# Patient Record
Sex: Female | Born: 2000 | Marital: Single
Health system: Southern US, Community
[De-identification: ages and names within clinical notes are randomized; demographics above are authoritative.]

## PROBLEM LIST (undated history)

## (undated) DIAGNOSIS — J189 Pneumonia, unspecified organism: Secondary | ICD-10-CM

## (undated) DIAGNOSIS — B029 Zoster without complications: Secondary | ICD-10-CM

## (undated) DIAGNOSIS — Z973 Presence of spectacles and contact lenses: Secondary | ICD-10-CM

## (undated) DIAGNOSIS — J45909 Unspecified asthma, uncomplicated: Secondary | ICD-10-CM

## (undated) DIAGNOSIS — M21619 Bunion of unspecified foot: Secondary | ICD-10-CM

## (undated) DIAGNOSIS — T7840XA Allergy, unspecified, initial encounter: Secondary | ICD-10-CM

## (undated) HISTORY — PX: WISDOM TOOTH EXTRACTION: SHX21

## (undated) HISTORY — PX: TYMPANOSTOMY TUBE PLACEMENT: SHX32

---
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
## 2021-01-05 ENCOUNTER — Telehealth: Payer: Self-pay | Admitting: Podiatry

## 2021-01-05 ENCOUNTER — Other Ambulatory Visit: Payer: Self-pay | Admitting: Podiatry

## 2021-01-05 DIAGNOSIS — M2012 Hallux valgus (acquired), left foot: Secondary | ICD-10-CM | POA: Diagnosis not present

## 2021-01-05 MED ORDER — IBUPROFEN 800 MG PO TABS
800.0000 mg | ORAL_TABLET | Freq: Four times a day (QID) | ORAL | 1 refills | Status: AC | PRN
Start: 2021-01-05 — End: ?

## 2021-01-05 MED ORDER — OXYCODONE-ACETAMINOPHEN 5-325 MG PO TABS: 1.0000 | ORAL_TABLET | ORAL | 0 refills | Status: AC | PRN

## 2021-01-05 NOTE — Telephone Encounter (Signed)
Patients mom calling to let you know the pharmacy couldn't fill her Rx for percocet. They told the patient your Rx instructions were not clear and will not fill the meds until they get clarity or a new script. If a new Rx is to be made, mom is requesting it be for 10mg  instead of 5mg , If that is ok. Please advise.

## 2021-01-06 ENCOUNTER — Telehealth: Payer: Self-pay | Admitting: *Deleted

## 2021-01-06 ENCOUNTER — Other Ambulatory Visit: Payer: Self-pay | Admitting: Podiatry

## 2021-01-06 MED ORDER — OXYCODONE-ACETAMINOPHEN 10-325 MG PO TABS: 1.0000 | ORAL_TABLET | ORAL | 0 refills | Status: AC | PRN

## 2021-01-06 MED ORDER — HYDROCODONE-ACETAMINOPHEN 10-325 MG PO TABS: 1.0000 | ORAL_TABLET | Freq: Four times a day (QID) | ORAL | 0 refills | Status: AC | PRN

## 2021-01-06 MED ORDER — OXYCODONE-ACETAMINOPHEN 5-325 MG PO TABS: 1.0000 | ORAL_TABLET | ORAL | 0 refills | Status: AC | PRN

## 2021-01-06 NOTE — Telephone Encounter (Signed)
Thank you, I will let the patient know. Will I also need to call the pharmacy to get the other ones canceled?

## 2021-01-06 NOTE — Addendum Note (Signed)
Addended by: Nicholes Rough on: 01/06/2021 02:25 PM   Modules accepted: Orders

## 2021-01-06 NOTE — Telephone Encounter (Signed)
I sent in another prescription with 10 mg.  Make sure they feel that 1.  They can cancel the other ones.

## 2021-01-06 NOTE — Telephone Encounter (Signed)
WE are going to do 10 mg one

## 2021-01-06 NOTE — Telephone Encounter (Signed)
Called pharmacy to informed of change of medication(hydrocodone-ace 10-325mg ) that has been sent and that patient is allergic to Percocet.   Called patient  to let her know that the new prescription is at pharmacy, ready for pick up,verbalized understanding.

## 2021-01-06 NOTE — Telephone Encounter (Signed)
I sent a different pain medication in and see if that helps.  She should not take Benadryl and Percocet together.  She can stop the Percocet so that where she no longer is itching.  Also call the pharmacy and let them know that patient is allergic to Percocet and can fill the Norco

## 2021-01-06 NOTE — Telephone Encounter (Signed)
[facility] pharmacy is wanting to know which prescription to fill. Stated that there were 2 different strengths sent in for Percocet(5-325 mg,10-325 mg). May call at : [phone],Please advise.

## 2021-01-06 NOTE — Telephone Encounter (Signed)
Patients mother calling to inform she has not been contacted by pharmacy to pick up the percocet 10mg . Mom also states that the patient has severe itching due to percocet and has been taking 2 benadryl with pain medication. Wants to know is there is anything else that can be done to relive the itching. Please advise

## 2021-01-12 ENCOUNTER — Other Ambulatory Visit: Payer: Self-pay | Admitting: Podiatry

## 2021-01-12 ENCOUNTER — Encounter: Payer: Self-pay | Admitting: Podiatry

## 2021-01-12 MED ORDER — OXYCODONE-ACETAMINOPHEN 10-325 MG PO TABS: 1.0000 | ORAL_TABLET | ORAL | 0 refills | Status: DC | PRN

## 2021-01-13 ENCOUNTER — Other Ambulatory Visit: Payer: Self-pay | Admitting: Podiatry

## 2021-01-13 NOTE — Telephone Encounter (Signed)
Please advisee 

## 2021-01-13 NOTE — Telephone Encounter (Signed)
Patients mom calling to request that percocet 5mg  be refilled. The Norco, which replaced the percocet 10mg  due to patient having allergic reaction (severe itching and was taking benadryl ), mom says its not working for pain. Mom states that the percocet 5mg  does not make her itch like the 10mg  (previously said 5mg  wasn't enough for pain, so she was doubling up to make 10mg ).

## 2021-01-14 ENCOUNTER — Other Ambulatory Visit: Payer: Self-pay | Admitting: Podiatry

## 2021-01-14 ENCOUNTER — Encounter: Payer: Self-pay | Admitting: Podiatry

## 2021-01-14 ENCOUNTER — Other Ambulatory Visit: Payer: Self-pay

## 2021-01-14 ENCOUNTER — Ambulatory Visit (INDEPENDENT_AMBULATORY_CARE_PROVIDER_SITE_OTHER): Payer: 59

## 2021-01-14 ENCOUNTER — Ambulatory Visit (INDEPENDENT_AMBULATORY_CARE_PROVIDER_SITE_OTHER): Payer: 59 | Admitting: Podiatry

## 2021-01-14 DIAGNOSIS — M2012 Hallux valgus (acquired), left foot: Secondary | ICD-10-CM

## 2021-01-14 DIAGNOSIS — Z9889 Other specified postprocedural states: Secondary | ICD-10-CM

## 2021-01-14 MED ORDER — DOXYCYCLINE HYCLATE 100 MG PO TABS: 100.0000 mg | ORAL_TABLET | Freq: Two times a day (BID) | ORAL | 0 refills | Status: AC

## 2021-01-14 NOTE — Progress Notes (Signed)
Subjective:  Patient ID: Joanna Jordan, female    DOB: 05-17-2001,  MRN: [id number]  Chief Complaint  Patient presents with  . Routine Post Op    POV#1 -pt denies N/V/F/Ch -pt states," it throbbs; 7/10" - dressing intact Tx: percocet, boot and scooter    DOS: 01/05/2021 Procedure: Left foot Lapidus procedure with head osteotomy  20 y.o. female returns for post-op check.  Patient is doing well.  She is doing good on pain medication.  She denies any other acute complaint she is nonweightbearing with a knee scooter.  Review of Systems: Negative except as noted in the HPI. Denies N/V/F/Ch.  Past Medical History:  Diagnosis Date  . Allergy    " seasonal "  . Asthma   . 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 Pneumonia    PMH as a child  . Shingles rash    at age 38 after varicella vaccine  . Wears glasses    and contact lenses    Current Outpatient Medications:  .  doxycycline (VIBRA-TABS) 100 MG tablet, Take 1 tablet (100 mg total) by mouth 2 (two) times daily., Disp: 20 tablet, Rfl: 0 .  acetaminophen-codeine (TYLENOL #3) 300-30 MG tablet, Take 1-2 tablets by mouth every 4 (four) hours as needed for moderate pain., Disp: 30 tablet, Rfl: 0 .  acetaminophen-codeine (TYLENOL #3) 300-30 MG tablet, Take 1-2 tablets by mouth every 4 (four) hours as needed for moderate pain., Disp: 30 tablet, Rfl: 0 .  acetaminophen-codeine (TYLENOL #3) 300-30 MG tablet, Take 1-2 tablets by mouth every 4 (four) hours as needed for moderate pain., Disp: 30 tablet, Rfl: 0 .  acetaminophen-codeine (TYLENOL #3) 300-30 MG tablet, Take 1-2 tablets by mouth every 4 (four) hours as needed for moderate pain., Disp: 30 tablet, Rfl: 0 .  bismuth subsalicylate (PEPTO BISMOL) 262 MG chewable tablet, Chew 524 mg by mouth daily as needed for indigestion or diarrhea or loose stools., Disp: , Rfl:  .  cetirizine (ZYRTEC) 10 MG tablet, Take 10 mg by mouth every evening. , Disp: ,  Rfl:  .  cholecalciferol (VITAMIN D3) 25 MCG (1000 UNIT) tablet, Take 1,000 Units by mouth daily., Disp: , Rfl:  .  fluticasone-salmeterol (ADVAIR HFA) 115-21 MCG/ACT inhaler, Inhale 2 puffs into the lungs 2 (two) times daily as needed (shortness of breath)., Disp: , Rfl:  .  HYDROcodone-acetaminophen (NORCO) 10-325 MG tablet, Take 1 tablet by mouth every 6 (six) hours as needed., Disp: 30 tablet, Rfl: 0 .  ibuprofen (ADVIL) 800 MG tablet, Take 1 tablet (800 mg total) by mouth every 6 (six) hours as needed., Disp: 60 tablet, Rfl: 1 .  ibuprofen (ADVIL) 800 MG tablet, Take 1 tablet (800 mg total) by mouth every 6 (six) hours as needed., Disp: 60 tablet, Rfl: 1 .  ibuprofen (ADVIL) 800 MG tablet, Take 1 tablet (800 mg total) by mouth every 6 (six) hours as needed., Disp: 60 tablet, Rfl: 1 .  ketorolac (TORADOL) 10 MG tablet, Take 1 tablet (10 mg total) by mouth every 6 (six) hours as needed., Disp: 20 tablet, Rfl: 0 .  Multiple Vitamin (MULTIVITAMIN WITH MINERALS) TABS tablet, Take 1 tablet by mouth every evening., Disp: , Rfl:  .  norethindrone-ethinyl estradiol (LOESTRIN) 1-20 MG-MCG tablet, Take 1 tablet by mouth every evening. , Disp: , Rfl:  .  oxyCODONE-acetaminophen (PERCOCET) 10-325 MG tablet, Take 1 tablet by mouth every 4 (four) hours as needed for pain., Disp:  30 tablet, Rfl: 0 .  oxyCODONE-acetaminophen (PERCOCET) 10-325 MG tablet, Take 1 tablet by mouth every 4 (four) hours as needed for pain., Disp: 30 tablet, Rfl: 0 .  oxyCODONE-acetaminophen (PERCOCET) 5-325 MG tablet, Take 1-2 tablets by mouth every 4 (four) hours as needed for severe pain., Disp: 30 tablet, Rfl: 0 .  oxyCODONE-acetaminophen (PERCOCET) 5-325 MG tablet, Take 1-2 tablets by mouth every 4 (four) hours as needed for severe pain., Disp: 30 tablet, Rfl: 0 .  oxyCODONE-acetaminophen (PERCOCET) 5-325 MG tablet, Take 1-2 tablets by mouth every 4 (four) hours as needed for severe pain., Disp: 30 tablet, Rfl: 0  Social History    Tobacco Use  Smoking Status Never Smoker  Smokeless Tobacco Never Used    No Known Allergies Objective:  There were no vitals filed for this visit. There is no height or weight on file to calculate BMI. Constitutional Well developed. Well nourished.  Vascular Foot warm and well perfused. Capillary refill normal to all digits.   Neurologic Normal speech. Oriented to person, place, and time. Epicritic sensation to light touch grossly present bilaterally.  Dermatologic Skin healing well without signs of infection. Skin edges well coapted without signs of infection.  Mild erythema noted.  Orthopedic:  No tenderness to palpation noted about the surgical site.   Radiographs: 3 views of skeletally mature adult left foot: Good correction alignment noted.  Hardware is intact.  No signs of backing out or loosening noted.  Reduction intermetatarsal angle noted.  Small osteotomy of the second metatarsal noted secondary to joint prepping Assessment:   1. Hallux abducto valgus, left   2. Hallux interphalangeus, acquired, left   3. Status post foot surgery    Plan:  Patient was evaluated and treated and all questions answered.  S/p foot surgery left -Progressing as expected post-operatively. -XR: See above number -WB Status: Nonweightbearing in left lower extremity in knee scooter -Sutures: Intact.  No clinical signs of dehiscence noted.  No complication noted. -Medications: Doxycycline for skin and soft tissue prophylaxis -Foot redressed.  No follow-ups on file.

## 2021-01-19 ENCOUNTER — Other Ambulatory Visit: Payer: Self-pay | Admitting: Podiatry

## 2021-01-19 ENCOUNTER — Encounter: Payer: Self-pay | Admitting: Podiatry

## 2021-01-19 MED ORDER — OXYCODONE-ACETAMINOPHEN 5-325 MG PO TABS: 1.0000 | ORAL_TABLET | ORAL | 0 refills | Status: AC | PRN

## 2021-01-26 ENCOUNTER — Encounter: Payer: Self-pay | Admitting: Podiatry

## 2021-01-27 ENCOUNTER — Other Ambulatory Visit: Payer: Self-pay | Admitting: Podiatry

## 2021-01-27 MED ORDER — OXYCODONE-ACETAMINOPHEN 5-325 MG PO TABS: 1.0000 | ORAL_TABLET | ORAL | 0 refills | Status: AC | PRN

## 2021-01-28 ENCOUNTER — Other Ambulatory Visit: Payer: Self-pay

## 2021-01-28 ENCOUNTER — Encounter: Payer: Self-pay | Admitting: Podiatry

## 2021-01-28 ENCOUNTER — Ambulatory Visit (INDEPENDENT_AMBULATORY_CARE_PROVIDER_SITE_OTHER): Payer: 59 | Admitting: Podiatry

## 2021-01-28 DIAGNOSIS — Z9889 Other specified postprocedural states: Secondary | ICD-10-CM

## 2021-01-28 DIAGNOSIS — M2012 Hallux valgus (acquired), left foot: Secondary | ICD-10-CM

## 2021-01-28 NOTE — Progress Notes (Signed)
Subjective:  Patient ID: Joanna Jordan, female    DOB: May 07, 2001,  MRN: [id number]  Chief Complaint  Patient presents with  . Routine Post Op    POST OP DOS 4.11.22     DOS: 01/05/2021 Procedure: Left foot Lapidus procedure with head osteotomy  20 y.o. female returns for post-op check.  Patient is doing well.  She is doing good on pain medication.  She denies any other acute complaint she is nonweightbearing with a knee scooter.  Review of Systems: Negative except as noted in the HPI. Denies N/V/F/Ch.  Past Medical History:  Diagnosis Date  . Allergy    " seasonal "  . Asthma   . 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 Pneumonia    PMH as a child  . Shingles rash    at age 44 after varicella vaccine  . Wears glasses    and contact lenses    Current Outpatient Medications:  .  acetaminophen-codeine (TYLENOL #3) 300-30 MG tablet, Take 1-2 tablets by mouth every 4 (four) hours as needed for moderate pain., Disp: 30 tablet, Rfl: 0 .  acetaminophen-codeine (TYLENOL #3) 300-30 MG tablet, Take 1-2 tablets by mouth every 4 (four) hours as needed for moderate pain., Disp: 30 tablet, Rfl: 0 .  acetaminophen-codeine (TYLENOL #3) 300-30 MG tablet, Take 1-2 tablets by mouth every 4 (four) hours as needed for moderate pain., Disp: 30 tablet, Rfl: 0 .  acetaminophen-codeine (TYLENOL #3) 300-30 MG tablet, Take 1-2 tablets by mouth every 4 (four) hours as needed for moderate pain., Disp: 30 tablet, Rfl: 0 .  bismuth subsalicylate (PEPTO BISMOL) 262 MG chewable tablet, Chew 524 mg by mouth daily as needed for indigestion or diarrhea or loose stools., Disp: , Rfl:  .  cetirizine (ZYRTEC) 10 MG tablet, Take 10 mg by mouth every evening. , Disp: , Rfl:  .  cholecalciferol (VITAMIN D3) 25 MCG (1000 UNIT) tablet, Take 1,000 Units by mouth daily., Disp: , Rfl:  .  doxycycline (VIBRA-TABS) 100 MG tablet, Take 1 tablet (100 mg total) by mouth 2 (two) times  daily., Disp: 20 tablet, Rfl: 0 .  fluticasone-salmeterol (ADVAIR HFA) 115-21 MCG/ACT inhaler, Inhale 2 puffs into the lungs 2 (two) times daily as needed (shortness of breath)., Disp: , Rfl:  .  HYDROcodone-acetaminophen (NORCO) 10-325 MG tablet, Take 1 tablet by mouth every 6 (six) hours as needed., Disp: 30 tablet, Rfl: 0 .  ibuprofen (ADVIL) 800 MG tablet, Take 1 tablet (800 mg total) by mouth every 6 (six) hours as needed., Disp: 60 tablet, Rfl: 1 .  ibuprofen (ADVIL) 800 MG tablet, Take 1 tablet (800 mg total) by mouth every 6 (six) hours as needed., Disp: 60 tablet, Rfl: 1 .  ibuprofen (ADVIL) 800 MG tablet, Take 1 tablet (800 mg total) by mouth every 6 (six) hours as needed., Disp: 60 tablet, Rfl: 1 .  ketorolac (TORADOL) 10 MG tablet, Take 1 tablet (10 mg total) by mouth every 6 (six) hours as needed., Disp: 20 tablet, Rfl: 0 .  Multiple Vitamin (MULTIVITAMIN WITH MINERALS) TABS tablet, Take 1 tablet by mouth every evening., Disp: , Rfl:  .  norethindrone-ethinyl estradiol (LOESTRIN) 1-20 MG-MCG tablet, Take 1 tablet by mouth every evening. , Disp: , Rfl:  .  oxyCODONE-acetaminophen (PERCOCET) 10-325 MG tablet, Take 1 tablet by mouth every 4 (four) hours as needed for pain., Disp: 30 tablet, Rfl: 0 .  oxyCODONE-acetaminophen (PERCOCET) 10-325 MG tablet, Take  1 tablet by mouth every 4 (four) hours as needed for pain., Disp: 30 tablet, Rfl: 0 .  oxyCODONE-acetaminophen (PERCOCET) 5-325 MG tablet, Take 1-2 tablets by mouth every 4 (four) hours as needed for severe pain., Disp: 30 tablet, Rfl: 0 .  oxyCODONE-acetaminophen (PERCOCET) 5-325 MG tablet, Take 1-2 tablets by mouth every 4 (four) hours as needed for severe pain., Disp: 30 tablet, Rfl: 0 .  oxyCODONE-acetaminophen (PERCOCET) 5-325 MG tablet, Take 1-2 tablets by mouth every 4 (four) hours as needed for severe pain., Disp: 30 tablet, Rfl: 0 .  oxyCODONE-acetaminophen (PERCOCET) 5-325 MG tablet, Take 1-2 tablets by mouth every 4 (four) hours  as needed for severe pain., Disp: 30 tablet, Rfl: 0 .  oxyCODONE-acetaminophen (PERCOCET) 5-325 MG tablet, Take 1-2 tablets by mouth every 4 (four) hours as needed for severe pain., Disp: 30 tablet, Rfl: 0  Social History   Tobacco Use  Smoking Status Never Smoker  Smokeless Tobacco Never Used    No Known Allergies Objective:  There were no vitals filed for this visit. There is no height or weight on file to calculate BMI. Constitutional Well developed. Well nourished.  Vascular Foot warm and well perfused. Capillary refill normal to all digits.   Neurologic Normal speech. Oriented to person, place, and time. Epicritic sensation to light touch grossly present bilaterally.  Dermatologic Skin healing well without signs of infection. Skin edges well coapted without signs of infection.  Mild erythema noted.  Orthopedic:  No tenderness to palpation noted about the surgical site.   Radiographs: 3 views of skeletally mature adult left foot: Good correction alignment noted.  Hardware is intact.  No signs of backing out or loosening noted.  Reduction intermetatarsal angle noted.  Small osteotomy of the second metatarsal noted secondary to joint prepping Assessment:   1. Hallux abducto valgus, left   2. Hallux interphalangeus, acquired, left   3. Status post foot surgery    Plan:  Patient was evaluated and treated and all questions answered.  S/p foot surgery left -Progressing as expected post-operatively. -XR: See above number -WB Status: Begin weightbearing as tolerated to the left lower extremity in 2 weeks with a cam boot -Sutures: Removed no clinical signs of dehiscence noted.  No complication noted. -Medications: None -Foot redressed.  No follow-ups on file.

## 2021-02-08 ENCOUNTER — Encounter: Payer: Self-pay | Admitting: Podiatry

## 2021-02-10 ENCOUNTER — Other Ambulatory Visit: Payer: Self-pay | Admitting: Podiatry

## 2021-02-10 MED ORDER — OXYCODONE-ACETAMINOPHEN 5-325 MG PO TABS: 1.0000 | ORAL_TABLET | ORAL | 0 refills | Status: AC | PRN

## 2021-03-04 ENCOUNTER — Ambulatory Visit (INDEPENDENT_AMBULATORY_CARE_PROVIDER_SITE_OTHER): Payer: 59

## 2021-03-04 ENCOUNTER — Other Ambulatory Visit: Payer: Self-pay

## 2021-03-04 ENCOUNTER — Ambulatory Visit (INDEPENDENT_AMBULATORY_CARE_PROVIDER_SITE_OTHER): Payer: 59 | Admitting: Podiatry

## 2021-03-04 DIAGNOSIS — T8484XA Pain due to internal orthopedic prosthetic devices, implants and grafts, initial encounter: Secondary | ICD-10-CM

## 2021-03-04 DIAGNOSIS — M2012 Hallux valgus (acquired), left foot: Secondary | ICD-10-CM | POA: Diagnosis not present

## 2021-03-04 DIAGNOSIS — Z9889 Other specified postprocedural states: Secondary | ICD-10-CM

## 2021-03-10 ENCOUNTER — Encounter: Payer: Self-pay | Admitting: Podiatry

## 2021-03-10 NOTE — Progress Notes (Signed)
Subjective:  Patient ID: Joanna Jordan, female    DOB: 2001/08/31,  MRN: [id number]  Chief Complaint  Patient presents with   Routine Post Op    POST OP DOS 4.11.22    DOS: 01/05/2021 Procedure: Left foot Lapidus procedure with head osteotomy  20 y.o. female returns for post-op check.  Patient is doing well.  She does not have much pain.  She has been weightbearing as tolerated regular shoes.  Review of Systems: Negative except as noted in the HPI. Denies N/V/F/Ch.  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, Disp: 30 tablet, Rfl: 0   acetaminophen-codeine (TYLENOL #3) 300-30 MG tablet, Take 1-2 tablets by mouth every 4 (four) hours as needed for moderate pain., Disp: 30 tablet, Rfl: 0   bismuth subsalicylate (PEPTO BISMOL) 262 MG chewable tablet, Chew 524 mg by mouth daily as needed for indigestion or diarrhea or loose stools., Disp: , Rfl:    cetirizine (ZYRTEC) 10 MG tablet, Take 10 mg by mouth every evening. , Disp: , Rfl:    cholecalciferol (VITAMIN D3) 25 MCG (1000 UNIT) tablet, Take 1,000 Units by mouth daily., Disp: , Rfl:    doxycycline (VIBRA-TABS) 100 MG tablet, Take 1 tablet (100 mg total) by mouth 2 (two) times daily., Disp: 20 tablet, Rfl: 0    fluticasone-salmeterol (ADVAIR HFA) 115-21 MCG/ACT inhaler, Inhale 2 puffs into the lungs 2 (two) times daily as needed (shortness of breath)., Disp: , Rfl:    HYDROcodone-acetaminophen (NORCO) 10-325 MG tablet, Take 1 tablet by mouth every 6 (six) hours as needed., Disp: 30 tablet, Rfl: 0   ibuprofen (ADVIL) 800 MG tablet, Take 1 tablet (800 mg total) by mouth every 6 (six) hours as needed., Disp: 60 tablet, Rfl: 1   ibuprofen (ADVIL) 800 MG tablet, Take 1 tablet (800 mg total) by mouth every 6 (six) hours as needed., Disp: 60 tablet, Rfl: 1   ibuprofen (ADVIL) 800 MG tablet, Take 1 tablet (800 mg total) by mouth every 6 (six) hours as needed., Disp: 60 tablet, Rfl: 1   ketorolac (TORADOL) 10 MG tablet, Take 1 tablet (10 mg total) by mouth every 6 (six) hours as needed., Disp: 20 tablet, Rfl: 0   Multiple Vitamin (MULTIVITAMIN WITH MINERALS) TABS tablet, Take 1 tablet by mouth every evening., Disp: , Rfl:    norethindrone-ethinyl estradiol (LOESTRIN) 1-20 MG-MCG tablet, Take 1 tablet by mouth every evening. , Disp: , Rfl:    oxyCODONE-acetaminophen (PERCOCET) 10-325 MG tablet, Take 1 tablet by mouth every 4 (four) hours as needed for pain., Disp: 30 tablet, Rfl: 0   oxyCODONE-acetaminophen (PERCOCET) 10-325 MG tablet, Take 1 tablet by mouth every 4 (four)  hours as needed for pain., Disp: 30 tablet, Rfl: 0   oxyCODONE-acetaminophen (PERCOCET) 5-325 MG tablet, Take 1-2 tablets by mouth every 4 (four) hours as needed for severe pain., Disp: 30 tablet, Rfl: 0   oxyCODONE-acetaminophen (PERCOCET) 5-325 MG tablet, Take 1-2 tablets by mouth every 4 (four) hours as needed for severe pain., Disp: 30 tablet, Rfl: 0   oxyCODONE-acetaminophen (PERCOCET) 5-325 MG tablet, Take 1-2 tablets by mouth every 4 (four) hours as needed for severe pain., Disp: 30 tablet, Rfl: 0   oxyCODONE-acetaminophen (PERCOCET) 5-325 MG tablet, Take 1-2 tablets by mouth every 4 (four) hours as needed for severe pain., Disp: 30 tablet,  Rfl: 0   oxyCODONE-acetaminophen (PERCOCET) 5-325 MG tablet, Take 1-2 tablets by mouth every 4 (four) hours as needed for severe pain., Disp: 30 tablet, Rfl: 0   oxyCODONE-acetaminophen (PERCOCET) 5-325 MG tablet, Take 1-2 tablets by mouth every 4 (four) hours as needed for severe pain., Disp: 30 tablet, Rfl: 0  Social History   Tobacco Use  Smoking Status Never  Smokeless Tobacco Never    No Known Allergies Objective:  There were no vitals filed for this visit. There is no height or weight on file to calculate BMI. Constitutional Well developed. Well nourished.  Vascular Foot warm and well perfused. Capillary refill normal to all digits.   Neurologic Normal speech. Oriented to person, place, and time. Epicritic sensation to light touch grossly present bilaterally.  Dermatologic Skin healing well without signs of infection. Skin edges well coapted without signs of infection.  Mild erythema noted.  Orthopedic:  No tenderness to palpation noted about the surgical site.   Radiographs: 3 views of skeletally mature adult left foot: Good correction alignment noted.  Hardware is intact.  No signs of backing out or loosening noted.  Reduction intermetatarsal angle noted.  Small osteotomy of the second metatarsal noted secondary to joint prepping Assessment:   1. Hallux abducto valgus, left   2. Hallux interphalangeus, acquired, left   3. Status post foot surgery    Plan:  Patient was evaluated and treated and all questions answered.  S/p foot surgery left -Patient has clinically transition to shoes.  However she is having pain near the hardware.  Painful orthopedic hardware -Clinically patient is having pain as well as superficial skin necrosis at the plate screw combination of the hardware at the first tarsometatarsal joint fusion.  Given this clinical finding I believe patient will benefit from a CT scan to evaluate the fusion of the first tarsometatarsal joint fusion.  If it has  already consolidated we will begin to consolidate we will plan on doing removal of the plates and screws.  Patient states understanding. -CT scan was sent    No follow-ups on file.

## 2021-04-03 NOTE — Telephone Encounter (Signed)
Patient would like to know if prior Berkley Harvey is needed for MR. Please advise.

## 2021-04-06 ENCOUNTER — Telehealth: Payer: Self-pay | Admitting: *Deleted

## 2021-04-06 NOTE — Telephone Encounter (Signed)
Patient has been scheduled for 04/10/21@4 ;30.Approval date from July 11th 2022-Aug. Case #[id number] Ref#[id number] Approved:[id number]-spoke w/ E. Waters

## 2021-04-07 NOTE — Telephone Encounter (Signed)
Approved#[id number]-spoke w/ E.Waters for ct scan, sent to [facility] imaging

## 2021-04-10 ENCOUNTER — Ambulatory Visit
Admission: RE | Admit: 2021-04-10 | Discharge: 2021-04-10 | Disposition: A | Discharge status: Home / Self Care | Payer: No Typology Code available for payment source | Source: Ambulatory Visit | Attending: Podiatry | Admitting: Podiatry

## 2021-04-10 DIAGNOSIS — M2012 Hallux valgus (acquired), left foot: Secondary | ICD-10-CM

## 2021-04-15 ENCOUNTER — Ambulatory Visit (INDEPENDENT_AMBULATORY_CARE_PROVIDER_SITE_OTHER): Payer: 59 | Admitting: Podiatry

## 2021-04-15 ENCOUNTER — Other Ambulatory Visit: Payer: Self-pay

## 2021-04-15 DIAGNOSIS — T8484XA Pain due to internal orthopedic prosthetic devices, implants and grafts, initial encounter: Secondary | ICD-10-CM

## 2021-04-15 DIAGNOSIS — M960 Pseudarthrosis after fusion or arthrodesis: Secondary | ICD-10-CM

## 2021-04-15 DIAGNOSIS — Z01818 Encounter for other preprocedural examination: Secondary | ICD-10-CM | POA: Diagnosis not present

## 2021-04-15 DIAGNOSIS — M2012 Hallux valgus (acquired), left foot: Secondary | ICD-10-CM | POA: Diagnosis not present

## 2021-04-21 ENCOUNTER — Encounter: Payer: Self-pay | Admitting: Podiatry

## 2021-04-21 NOTE — Progress Notes (Signed)
Subjective:  Patient ID: Joanna Jordan, female    DOB: 2001/02/15,  MRN: [id number]  Chief Complaint  Patient presents with   Routine Post Op    POST OP DOS 4.11.22    DOS: 01/05/2021 Procedure: Left foot Lapidus procedure with head osteotomy  20 y.o. female returns for post-op check.  Patient presents for follow-up of continuous orthopedic hardware pain as well as concern for hallux interphalangeus.  Patient would like to discuss a CT scan which shows partial nonunion.  She would like to have the hardware removed as well and fix interphalangeus.  She would like to discuss surgical options at this time.  She denies any other acute complaints.  Review of Systems: Negative except as noted in the HPI. Denies N/V/F/Ch.  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, Disp: 30 tablet, Rfl: 0   acetaminophen-codeine (TYLENOL #3) 300-30 MG tablet, Take 1-2 tablets by mouth every 4 (four) hours as needed for moderate pain., Disp: 30 tablet, Rfl: 0   bismuth subsalicylate (PEPTO BISMOL) 262 MG chewable tablet, Chew 524 mg by mouth daily as needed for indigestion or diarrhea or loose stools., Disp: , Rfl:    cetirizine (ZYRTEC) 10 MG tablet, Take 10 mg by mouth every evening. , Disp: , Rfl:     cholecalciferol (VITAMIN D3) 25 MCG (1000 UNIT) tablet, Take 1,000 Units by mouth daily., Disp: , Rfl:    doxycycline (VIBRA-TABS) 100 MG tablet, Take 1 tablet (100 mg total) by mouth 2 (two) times daily., Disp: 20 tablet, Rfl: 0   fluticasone-salmeterol (ADVAIR HFA) 115-21 MCG/ACT inhaler, Inhale 2 puffs into the lungs 2 (two) times daily as needed (shortness of breath)., Disp: , Rfl:    HYDROcodone-acetaminophen (NORCO) 10-325 MG tablet, Take 1 tablet by mouth every 6 (six) hours as needed., Disp: 30 tablet, Rfl: 0   ibuprofen (ADVIL) 800 MG tablet, Take 1 tablet (800 mg total) by mouth every 6 (six) hours as needed., Disp: 60 tablet, Rfl: 1   ibuprofen (ADVIL) 800 MG tablet, Take 1 tablet (800 mg total) by mouth every 6 (six) hours as needed., Disp: 60 tablet, Rfl: 1   ibuprofen (ADVIL) 800 MG tablet, Take 1 tablet (800 mg total) by mouth every 6 (six) hours as needed., Disp: 60 tablet, Rfl: 1   ketorolac (TORADOL) 10 MG tablet, Take 1 tablet (10 mg total) by mouth every 6 (six) hours as needed., Disp: 20 tablet, Rfl: 0   Multiple Vitamin (MULTIVITAMIN WITH MINERALS) TABS tablet, Take 1 tablet by mouth every evening., Disp: , Rfl:    norethindrone-ethinyl estradiol (LOESTRIN) 1-20 MG-MCG tablet, Take 1 tablet by mouth every evening. , Disp: , Rfl:  oxyCODONE-acetaminophen (PERCOCET) 10-325 MG tablet, Take 1 tablet by mouth every 4 (four) hours as needed for pain., Disp: 30 tablet, Rfl: 0   oxyCODONE-acetaminophen (PERCOCET) 10-325 MG tablet, Take 1 tablet by mouth every 4 (four) hours as needed for pain., Disp: 30 tablet, Rfl: 0   oxyCODONE-acetaminophen (PERCOCET) 5-325 MG tablet, Take 1-2 tablets by mouth every 4 (four) hours as needed for severe pain., Disp: 30 tablet, Rfl: 0   oxyCODONE-acetaminophen (PERCOCET) 5-325 MG tablet, Take 1-2 tablets by mouth every 4 (four) hours as needed for severe pain., Disp: 30 tablet, Rfl: 0   oxyCODONE-acetaminophen (PERCOCET) 5-325 MG tablet, Take 1-2  tablets by mouth every 4 (four) hours as needed for severe pain., Disp: 30 tablet, Rfl: 0   oxyCODONE-acetaminophen (PERCOCET) 5-325 MG tablet, Take 1-2 tablets by mouth every 4 (four) hours as needed for severe pain., Disp: 30 tablet, Rfl: 0   oxyCODONE-acetaminophen (PERCOCET) 5-325 MG tablet, Take 1-2 tablets by mouth every 4 (four) hours as needed for severe pain., Disp: 30 tablet, Rfl: 0   oxyCODONE-acetaminophen (PERCOCET) 5-325 MG tablet, Take 1-2 tablets by mouth every 4 (four) hours as needed for severe pain., Disp: 30 tablet, Rfl: 0  Social History   Tobacco Use  Smoking Status Never  Smokeless Tobacco Never    No Known Allergies Objective:  There were no vitals filed for this visit. There is no height or weight on file to calculate BMI. Constitutional Well developed. Well nourished.  Vascular Foot warm and well perfused. Capillary refill normal to all digits.   Neurologic Normal speech. Oriented to person, place, and time. Epicritic sensation to light touch grossly present bilaterally.  Dermatologic Skin has completely epithelialized.  Hallux interphalangeus angle increased noted.  No bunion deformity noted.  Pain on palpation to the orthopedic hardware.  Orthopedic:  No tenderness to palpation noted about the surgical site.   Radiographs: 3 views of skeletally mature adult left foot: Good correction alignment noted.  Hardware is intact.  No signs of backing out or loosening noted.  Reduction intermetatarsal angle noted.  Small osteotomy of the second metatarsal noted secondary to joint prepping  1. Prior first TMT joint arthrodesis with partial osseous fusion. No hardware complication. 2. Partially healed osteotomy at the base of the second metatarsal shaft. Assessment:   1. Hallux interphalangeus, acquired, left   2. Painful orthopaedic hardware (HCC)   3. Nonunion after arthrodesis   4. Preoperative examination     Plan:  Patient was evaluated and treated and  all questions answered.  S/p foot surgery left -Patient has clinically transition to shoes.  However she is having pain near the hardware.  Painful orthopedic hardware -Clinically she still continues to have orthopedic hardware.  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  At this time I discussed with the patient that we can plan for surgically revising the fusion if there is not adequate fusion associated with it.  I can augment the fusion with staple fixation to allow for further compression on the plantar ledge.  I discussed this with the patient in extensive detail the patient is also here with her mother today and I discussed the case.  We will plan on doing revision of the tarsometatarsal joint fusion with the use of staple fixation as needed.  I will plan on removing all the previous hardware as well except for the first metatarsal osteotomy site.  If there is adequate fusion  then I will hold off on revising it and will discuss bone stimulator. -Informed surgical risk consent was reviewed and read aloud to the patient.  I reviewed the films.  I have discussed my findings with the patient in great detail.  I have discussed all risks including but not limited to infection, stiffness, scarring, limp, disability, deformity, damage to blood vessels and nerves, numbness, poor healing, need for braces, arthritis, chronic pain, amputation, death.  All benefits and realistic expectations discussed in great detail.  I have made no promises as to the outcome.  I have provided realistic expectations.  I have offered the patient a 2nd opinion, which they have declined and assured me they preferred to proceed despite the risks   Left hallux interphalangeus -I also discussed with her while I am removing the hardware and revise the first tarsometatarsal joint fusion we can discuss phalangeal osteotomy to help straighten out the toe.  I  discussed the plan in extensive detail with the patient and her mother.  They both left would like to proceed despite all the risk and complication of surgery with her.    No follow-ups on file.

## 2021-05-13 ENCOUNTER — Telehealth: Payer: Self-pay | Admitting: Urology

## 2021-05-13 NOTE — Telephone Encounter (Signed)
DOS - 05/18/21  Quintella Reichert OSTEOTOMY LEFT --- 65784 REPAIR NON BUNION LEFT --- 69629 REMOVAL FIXATION DEEP LEFT ---  20680   [facility] EFFECTIVE DATE - 09/27/20   PLAN DEDUCTIBLE - $2,000.00 W/ $0.00 REMAINING OUT OF POCKET - $7,150.00 W/ $886.71 REMAINING COINSURANCE - 20% COPAY - $0.00  PER [facility] SITE CPT CODES 52841 AND 20680 ARE APPROVED, AUTH # [id number].  PER [employer] WEB SITE CPT CODE 53664 Notification or Prior Authorization is not required for the requested services   Decision ID #:[id number]

## 2021-05-18 ENCOUNTER — Encounter: Payer: Self-pay | Admitting: Podiatry

## 2021-05-18 ENCOUNTER — Telehealth: Payer: Self-pay | Admitting: Podiatry

## 2021-05-18 ENCOUNTER — Other Ambulatory Visit: Payer: Self-pay | Admitting: Podiatry

## 2021-05-18 DIAGNOSIS — Z4889 Encounter for other specified surgical aftercare: Secondary | ICD-10-CM | POA: Diagnosis not present

## 2021-05-18 DIAGNOSIS — M2012 Hallux valgus (acquired), left foot: Secondary | ICD-10-CM | POA: Diagnosis not present

## 2021-05-18 DIAGNOSIS — M2042 Other hammer toe(s) (acquired), left foot: Secondary | ICD-10-CM | POA: Diagnosis not present

## 2021-05-18 MED ORDER — IBUPROFEN 800 MG PO TABS: 800.0000 mg | ORAL_TABLET | Freq: Four times a day (QID) | ORAL | 1 refills | Status: AC | PRN

## 2021-05-18 MED ORDER — ONDANSETRON HCL 4 MG PO TABS: 4.0000 mg | ORAL_TABLET | Freq: Three times a day (TID) | ORAL | 0 refills | Status: AC | PRN

## 2021-05-18 MED ORDER — OXYCODONE-ACETAMINOPHEN 5-325 MG PO TABS: 1.0000 | ORAL_TABLET | ORAL | 0 refills | Status: AC | PRN

## 2021-05-18 NOTE — Telephone Encounter (Signed)
Patients mom called about Percocet, ibuprofen, Zofran. She has been gone from sx about 4 hours,

## 2021-05-24 ENCOUNTER — Encounter: Payer: Self-pay | Admitting: Podiatry

## 2021-05-25 ENCOUNTER — Other Ambulatory Visit: Payer: Self-pay | Admitting: Podiatry

## 2021-05-25 MED ORDER — OXYCODONE-ACETAMINOPHEN 5-325 MG PO TABS: 1.0000 | ORAL_TABLET | ORAL | 0 refills | Status: AC | PRN

## 2021-05-26 ENCOUNTER — Encounter: Payer: Self-pay | Admitting: Podiatry

## 2021-05-27 ENCOUNTER — Other Ambulatory Visit: Payer: Self-pay | Admitting: Podiatry

## 2021-05-27 ENCOUNTER — Ambulatory Visit (INDEPENDENT_AMBULATORY_CARE_PROVIDER_SITE_OTHER): Payer: 59 | Admitting: Podiatry

## 2021-05-27 ENCOUNTER — Other Ambulatory Visit: Payer: Self-pay

## 2021-05-27 ENCOUNTER — Ambulatory Visit (INDEPENDENT_AMBULATORY_CARE_PROVIDER_SITE_OTHER): Payer: 59

## 2021-05-27 DIAGNOSIS — Z9889 Other specified postprocedural states: Secondary | ICD-10-CM | POA: Diagnosis not present

## 2021-05-27 DIAGNOSIS — M2012 Hallux valgus (acquired), left foot: Secondary | ICD-10-CM

## 2021-05-27 DIAGNOSIS — M960 Pseudarthrosis after fusion or arthrodesis: Secondary | ICD-10-CM

## 2021-05-27 DIAGNOSIS — T8484XA Pain due to internal orthopedic prosthetic devices, implants and grafts, initial encounter: Secondary | ICD-10-CM

## 2021-05-27 MED ORDER — ONDANSETRON HCL 4 MG PO TABS: 4.0000 mg | ORAL_TABLET | Freq: Three times a day (TID) | ORAL | 0 refills | Status: AC | PRN

## 2021-05-28 NOTE — Progress Notes (Signed)
Subjective:  Patient ID: Joanna Jordan, female    DOB: January 23, 2001,  MRN: [id number]  Chief Complaint  Patient presents with   Routine Post Op    POS DOS 8.22.22    DOS: 05/18/2021 Procedure: Left revisional Lapidus procedure with Akin osteotomy  20 y.o. female returns for post-op check.  Patient states she is doing well.  Denies any acute complaints.  Mild pain.  Feels a lot better.  Bandages clean dry and intact nonweightbearing to the left side  Review of Systems: Negative except as noted in the HPI. Denies N/V/F/Ch.  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, Disp: 30 tablet, Rfl: 0   acetaminophen-codeine (TYLENOL #3) 300-30 MG tablet, Take 1-2 tablets by mouth every 4 (four) hours as needed for moderate pain., Disp: 30 tablet, Rfl: 0   bismuth subsalicylate (PEPTO BISMOL) 262 MG chewable tablet, Chew 524 mg by mouth daily as needed for indigestion or diarrhea or loose stools., Disp: , Rfl:    cetirizine (ZYRTEC) 10 MG tablet, Take 10 mg by mouth every evening. , Disp: , Rfl:    cholecalciferol (VITAMIN D3) 25 MCG (1000 UNIT) tablet, Take 1,000 Units by mouth daily., Disp: , Rfl:    doxycycline (VIBRA-TABS) 100 MG tablet, Take 1 tablet (100 mg total) by mouth 2 (two) times  daily., Disp: 20 tablet, Rfl: 0   fluticasone-salmeterol (ADVAIR HFA) 115-21 MCG/ACT inhaler, Inhale 2 puffs into the lungs 2 (two) times daily as needed (shortness of breath)., Disp: , Rfl:    HYDROcodone-acetaminophen (NORCO) 10-325 MG tablet, Take 1 tablet by mouth every 6 (six) hours as needed., Disp: 30 tablet, Rfl: 0   ibuprofen (ADVIL) 800 MG tablet, Take 1 tablet (800 mg total) by mouth every 6 (six) hours as needed., Disp: 60 tablet, Rfl: 1   ibuprofen (ADVIL) 800 MG tablet, Take 1 tablet (800 mg total) by mouth every 6 (six) hours as needed., Disp: 60 tablet, Rfl: 1   ibuprofen (ADVIL) 800 MG tablet, Take 1 tablet (800 mg total) by mouth every 6 (six) hours as needed., Disp: 60 tablet, Rfl: 1   ibuprofen (ADVIL) 800 MG tablet, Take 1 tablet (800 mg total) by mouth every 6 (six) hours as needed., Disp: 60 tablet, Rfl: 1   ketorolac (TORADOL) 10 MG tablet, Take 1 tablet (10 mg total) by mouth every 6 (six) hours as needed., Disp: 20 tablet, Rfl: 0   Multiple Vitamin (MULTIVITAMIN WITH MINERALS) TABS tablet, Take 1 tablet by mouth every evening., Disp: , Rfl:    norethindrone-ethinyl estradiol (LOESTRIN) 1-20 MG-MCG tablet, Take 1 tablet by mouth every evening. , Disp: , Rfl:    ondansetron (ZOFRAN) 4  MG tablet, Take 1 tablet (4 mg total) by mouth every 8 (eight) hours as needed for nausea or vomiting., Disp: 20 tablet, Rfl: 0   ondansetron (ZOFRAN) 4 MG tablet, Take 1 tablet (4 mg total) by mouth every 8 (eight) hours as needed for nausea or vomiting., Disp: 20 tablet, Rfl: 0   oxyCODONE-acetaminophen (PERCOCET) 10-325 MG tablet, Take 1 tablet by mouth every 4 (four) hours as needed for pain., Disp: 30 tablet, Rfl: 0   oxyCODONE-acetaminophen (PERCOCET) 10-325 MG tablet, Take 1 tablet by mouth every 4 (four) hours as needed for pain., Disp: 30 tablet, Rfl: 0   oxyCODONE-acetaminophen (PERCOCET) 5-325 MG tablet, Take 1-2 tablets by mouth every 4 (four) hours as needed for severe pain., Disp: 30  tablet, Rfl: 0   oxyCODONE-acetaminophen (PERCOCET) 5-325 MG tablet, Take 1-2 tablets by mouth every 4 (four) hours as needed for severe pain., Disp: 30 tablet, Rfl: 0   oxyCODONE-acetaminophen (PERCOCET) 5-325 MG tablet, Take 1-2 tablets by mouth every 4 (four) hours as needed for severe pain., Disp: 30 tablet, Rfl: 0   oxyCODONE-acetaminophen (PERCOCET) 5-325 MG tablet, Take 1-2 tablets by mouth every 4 (four) hours as needed for severe pain., Disp: 30 tablet, Rfl: 0   oxyCODONE-acetaminophen (PERCOCET) 5-325 MG tablet, Take 1-2 tablets by mouth every 4 (four) hours as needed for severe pain., Disp: 30 tablet, Rfl: 0   oxyCODONE-acetaminophen (PERCOCET) 5-325 MG tablet, Take 1-2 tablets by mouth every 4 (four) hours as needed for severe pain., Disp: 30 tablet, Rfl: 0   oxyCODONE-acetaminophen (PERCOCET) 5-325 MG tablet, Take 1-2 tablets by mouth every 4 (four) hours as needed for severe pain., Disp: 30 tablet, Rfl: 0   oxyCODONE-acetaminophen (PERCOCET) 5-325 MG tablet, Take 1 tablet by mouth every 4 (four) hours as needed for severe pain., Disp: 30 tablet, Rfl: 0  Social History   Tobacco Use  Smoking Status Never  Smokeless Tobacco Never    No Known Allergies Objective:  There were no vitals filed for this visit. There is no height or weight on file to calculate BMI. Constitutional Well developed. Well nourished.  Vascular Foot warm and well perfused. Capillary refill normal to all digits.   Neurologic Normal speech. Oriented to person, place, and time. Epicritic sensation to light touch grossly present bilaterally.  Dermatologic Skin healing well without signs of infection. Skin edges well coapted without signs of infection.  Orthopedic: Tenderness to palpation noted about the surgical site.   Radiographs: 3 views of skeletally mature adult left foot: Good correction alignment noted.  Hardware is intact no signs of backing out or loosening noted Assessment:   1. Status post foot  surgery   2. Hallux interphalangeus, acquired, left   3. Painful orthopaedic hardware (HCC)   4. Nonunion after arthrodesis    Plan:  Patient was evaluated and treated and all questions answered.  S/p foot surgery left -Progressing as expected post-operatively. -XR: See above -WB Status: Nonweightbearing in left lower extremity in knee scooter -Sutures: Intact.  No clinical signs of dehiscence noted.  No complication noted. -Medications: None -Foot redressed.  No follow-ups on file.

## 2021-06-10 ENCOUNTER — Other Ambulatory Visit: Payer: Self-pay

## 2021-06-10 ENCOUNTER — Ambulatory Visit (INDEPENDENT_AMBULATORY_CARE_PROVIDER_SITE_OTHER): Payer: No Typology Code available for payment source | Admitting: Podiatry

## 2021-06-10 DIAGNOSIS — M2012 Hallux valgus (acquired), left foot: Secondary | ICD-10-CM

## 2021-06-10 DIAGNOSIS — T8484XA Pain due to internal orthopedic prosthetic devices, implants and grafts, initial encounter: Secondary | ICD-10-CM

## 2021-06-10 DIAGNOSIS — Z9889 Other specified postprocedural states: Secondary | ICD-10-CM

## 2021-06-12 NOTE — Progress Notes (Signed)
Subjective:  Patient ID: Joanna Jordan, female    DOB: 01/16/2001,  MRN: [id number]  Chief Complaint  Patient presents with   Routine Post Olena Mater 8.22.22    DOS: 05/18/2021 Procedure: Left revisional Lapidus procedure with Akin osteotomy  20 y.o. female returns for post-op check.  Patient states she is doing well.  Denies any acute complaints.  Mild pain.  Feels a lot better.  Bandages clean dry and intact nonweightbearing to the left side  Review of Systems: Negative except as noted in the HPI. Denies N/V/F/Ch.  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, Disp: 30 tablet, Rfl: 0   acetaminophen-codeine (TYLENOL #3) 300-30 MG tablet, Take 1-2 tablets by mouth every 4 (four) hours as needed for moderate pain., Disp: 30 tablet, Rfl: 0   bismuth subsalicylate (PEPTO BISMOL) 262 MG chewable tablet, Chew 524 mg by mouth daily as needed for indigestion or diarrhea or loose stools., Disp: , Rfl:    cetirizine (ZYRTEC) 10 MG tablet, Take 10 mg by mouth every evening. , Disp: , Rfl:    cholecalciferol (VITAMIN D3) 25 MCG (1000 UNIT) tablet, Take 1,000 Units by mouth daily., Disp: , Rfl:    doxycycline (VIBRA-TABS) 100 MG tablet, Take 1 tablet (100 mg total) by mouth 2 (two) times  daily., Disp: 20 tablet, Rfl: 0   fluticasone-salmeterol (ADVAIR HFA) 115-21 MCG/ACT inhaler, Inhale 2 puffs into the lungs 2 (two) times daily as needed (shortness of breath)., Disp: , Rfl:    HYDROcodone-acetaminophen (NORCO) 10-325 MG tablet, Take 1 tablet by mouth every 6 (six) hours as needed., Disp: 30 tablet, Rfl: 0   ibuprofen (ADVIL) 800 MG tablet, Take 1 tablet (800 mg total) by mouth every 6 (six) hours as needed., Disp: 60 tablet, Rfl: 1   ibuprofen (ADVIL) 800 MG tablet, Take 1 tablet (800 mg total) by mouth every 6 (six) hours as needed., Disp: 60 tablet, Rfl: 1   ibuprofen (ADVIL) 800 MG tablet, Take 1 tablet (800 mg total) by mouth every 6 (six) hours as needed., Disp: 60 tablet, Rfl: 1   ibuprofen (ADVIL) 800 MG tablet, Take 1 tablet (800 mg total) by mouth every 6 (six) hours as needed., Disp: 60 tablet, Rfl: 1   ketorolac (TORADOL) 10 MG tablet, Take 1 tablet (10 mg total) by mouth every 6 (six) hours as needed., Disp: 20 tablet, Rfl: 0   Multiple Vitamin (MULTIVITAMIN WITH MINERALS) TABS tablet, Take 1 tablet by mouth every evening., Disp: , Rfl:    norethindrone-ethinyl estradiol (LOESTRIN) 1-20 MG-MCG tablet, Take 1 tablet by mouth every evening. , Disp: , Rfl:    ondansetron (ZOFRAN) 4 MG  tablet, Take 1 tablet (4 mg total) by mouth every 8 (eight) hours as needed for nausea or vomiting., Disp: 20 tablet, Rfl: 0   ondansetron (ZOFRAN) 4 MG tablet, Take 1 tablet (4 mg total) by mouth every 8 (eight) hours as needed for nausea or vomiting., Disp: 20 tablet, Rfl: 0   oxyCODONE-acetaminophen (PERCOCET) 10-325 MG tablet, Take 1 tablet by mouth every 4 (four) hours as needed for pain., Disp: 30 tablet, Rfl: 0   oxyCODONE-acetaminophen (PERCOCET) 10-325 MG tablet, Take 1 tablet by mouth every 4 (four) hours as needed for pain., Disp: 30 tablet, Rfl: 0   oxyCODONE-acetaminophen (PERCOCET) 5-325 MG tablet, Take 1-2 tablets by mouth every 4 (four) hours as needed for severe pain., Disp: 30  tablet, Rfl: 0   oxyCODONE-acetaminophen (PERCOCET) 5-325 MG tablet, Take 1-2 tablets by mouth every 4 (four) hours as needed for severe pain., Disp: 30 tablet, Rfl: 0   oxyCODONE-acetaminophen (PERCOCET) 5-325 MG tablet, Take 1-2 tablets by mouth every 4 (four) hours as needed for severe pain., Disp: 30 tablet, Rfl: 0   oxyCODONE-acetaminophen (PERCOCET) 5-325 MG tablet, Take 1-2 tablets by mouth every 4 (four) hours as needed for severe pain., Disp: 30 tablet, Rfl: 0   oxyCODONE-acetaminophen (PERCOCET) 5-325 MG tablet, Take 1-2 tablets by mouth every 4 (four) hours as needed for severe pain., Disp: 30 tablet, Rfl: 0   oxyCODONE-acetaminophen (PERCOCET) 5-325 MG tablet, Take 1-2 tablets by mouth every 4 (four) hours as needed for severe pain., Disp: 30 tablet, Rfl: 0   oxyCODONE-acetaminophen (PERCOCET) 5-325 MG tablet, Take 1-2 tablets by mouth every 4 (four) hours as needed for severe pain., Disp: 30 tablet, Rfl: 0   oxyCODONE-acetaminophen (PERCOCET) 5-325 MG tablet, Take 1 tablet by mouth every 4 (four) hours as needed for severe pain., Disp: 30 tablet, Rfl: 0  Social History   Tobacco Use  Smoking Status Never  Smokeless Tobacco Never    No Known Allergies Objective:  There were no vitals filed for this visit. There is no height or weight on file to calculate BMI. Constitutional Well developed. Well nourished.  Vascular Foot warm and well perfused. Capillary refill normal to all digits.   Neurologic Normal speech. Oriented to person, place, and time. Epicritic sensation to light touch grossly present bilaterally.  Dermatologic Skin has completely epithelialized.  Good range of motion noted first metatarsophalangeal joint.  I encouraged her to continue increasing the range of motion  Orthopedic: Mild tenderness to palpation noted about the surgical site.   Radiographs: 3 views of skeletally mature adult left foot: Good correction alignment noted.  Hardware is intact no signs of  backing out or loosening noted Assessment:   1. Status post foot surgery   2. Painful orthopaedic hardware (HCC)   3. Hallux interphalangeus, acquired, left     Plan:  Patient was evaluated and treated and all questions answered.  S/p foot surgery left -Progressing as expected post-operatively. -XR: See above -WB Status: Begin slowly weightbearing as tolerated to the left lower extremity in cam boot -Sutures: I removed no clinical signs of dehiscence noted.  No complication noted. -Medications: None -Foot redressed.  No follow-ups on file.

## 2021-07-08 ENCOUNTER — Other Ambulatory Visit: Payer: Self-pay

## 2021-07-08 ENCOUNTER — Ambulatory Visit (INDEPENDENT_AMBULATORY_CARE_PROVIDER_SITE_OTHER): Payer: No Typology Code available for payment source

## 2021-07-08 ENCOUNTER — Ambulatory Visit (INDEPENDENT_AMBULATORY_CARE_PROVIDER_SITE_OTHER): Payer: No Typology Code available for payment source | Admitting: Podiatry

## 2021-07-08 DIAGNOSIS — Q666 Other congenital valgus deformities of feet: Secondary | ICD-10-CM

## 2021-07-08 DIAGNOSIS — Z9889 Other specified postprocedural states: Secondary | ICD-10-CM | POA: Diagnosis not present

## 2021-07-13 NOTE — Progress Notes (Signed)
Subjective:  Patient ID: Joanna Jordan, female    DOB: 2001-03-21,  MRN: [id number]  Chief Complaint  Patient presents with   Routine Post Olena Mater 8.22.22    DOS: 05/18/2021 Procedure: Left revisional Lapidus procedure with Akin osteotomy  20 y.o. female returns for post-op check.  Patient states she is doing well.  Denies any acute complaints.  Mild pain.  Feels a lot better.  She can begin transitioning to regular shoes and increasing range of motion exercises.  Review of Systems: Negative except as noted in the HPI. Denies N/V/F/Ch.  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, Disp: 30 tablet, Rfl: 0   acetaminophen-codeine (TYLENOL #3) 300-30 MG tablet, Take 1-2 tablets by mouth every 4 (four) hours as needed for moderate pain., Disp: 30 tablet, Rfl: 0   bismuth subsalicylate (PEPTO BISMOL) 262 MG chewable tablet, Chew 524 mg by mouth daily as needed for indigestion or diarrhea or loose stools., Disp: , Rfl:    cetirizine (ZYRTEC) 10 MG tablet, Take 10 mg by mouth every evening. , Disp: , Rfl:    cholecalciferol (VITAMIN D3) 25 MCG (1000 UNIT) tablet, Take 1,000 Units by mouth daily., Disp: , Rfl:    doxycycline (VIBRA-TABS) 100 MG tablet, Take 1 tablet (100 mg total) by  mouth 2 (two) times daily., Disp: 20 tablet, Rfl: 0   fluticasone-salmeterol (ADVAIR HFA) 115-21 MCG/ACT inhaler, Inhale 2 puffs into the lungs 2 (two) times daily as needed (shortness of breath)., Disp: , Rfl:    HYDROcodone-acetaminophen (NORCO) 10-325 MG tablet, Take 1 tablet by mouth every 6 (six) hours as needed., Disp: 30 tablet, Rfl: 0   ibuprofen (ADVIL) 800 MG tablet, Take 1 tablet (800 mg total) by mouth every 6 (six) hours as needed., Disp: 60 tablet, Rfl: 1   ibuprofen (ADVIL) 800 MG tablet, Take 1 tablet (800 mg total) by mouth every 6 (six) hours as needed., Disp: 60 tablet, Rfl: 1   ibuprofen (ADVIL) 800 MG tablet, Take 1 tablet (800 mg total) by mouth every 6 (six) hours as needed., Disp: 60 tablet, Rfl: 1   ibuprofen (ADVIL) 800 MG tablet, Take 1 tablet (800 mg total) by mouth every 6 (six) hours as needed., Disp: 60 tablet, Rfl: 1   ketorolac (TORADOL) 10 MG tablet, Take 1 tablet (10 mg total) by mouth every 6 (six) hours as needed., Disp: 20 tablet, Rfl: 0   Multiple Vitamin (MULTIVITAMIN WITH MINERALS) TABS tablet, Take 1 tablet by mouth every evening., Disp: , Rfl:    norethindrone-ethinyl estradiol (LOESTRIN) 1-20 MG-MCG tablet, Take 1 tablet by mouth every evening. , Disp: , Rfl:    ondansetron (  ZOFRAN) 4 MG tablet, Take 1 tablet (4 mg total) by mouth every 8 (eight) hours as needed for nausea or vomiting., Disp: 20 tablet, Rfl: 0   ondansetron (ZOFRAN) 4 MG tablet, Take 1 tablet (4 mg total) by mouth every 8 (eight) hours as needed for nausea or vomiting., Disp: 20 tablet, Rfl: 0   oxyCODONE-acetaminophen (PERCOCET) 10-325 MG tablet, Take 1 tablet by mouth every 4 (four) hours as needed for pain., Disp: 30 tablet, Rfl: 0   oxyCODONE-acetaminophen (PERCOCET) 10-325 MG tablet, Take 1 tablet by mouth every 4 (four) hours as needed for pain., Disp: 30 tablet, Rfl: 0   oxyCODONE-acetaminophen (PERCOCET) 5-325 MG tablet, Take 1-2 tablets by mouth every 4 (four) hours as needed for  severe pain., Disp: 30 tablet, Rfl: 0   oxyCODONE-acetaminophen (PERCOCET) 5-325 MG tablet, Take 1-2 tablets by mouth every 4 (four) hours as needed for severe pain., Disp: 30 tablet, Rfl: 0   oxyCODONE-acetaminophen (PERCOCET) 5-325 MG tablet, Take 1-2 tablets by mouth every 4 (four) hours as needed for severe pain., Disp: 30 tablet, Rfl: 0   oxyCODONE-acetaminophen (PERCOCET) 5-325 MG tablet, Take 1-2 tablets by mouth every 4 (four) hours as needed for severe pain., Disp: 30 tablet, Rfl: 0   oxyCODONE-acetaminophen (PERCOCET) 5-325 MG tablet, Take 1-2 tablets by mouth every 4 (four) hours as needed for severe pain., Disp: 30 tablet, Rfl: 0   oxyCODONE-acetaminophen (PERCOCET) 5-325 MG tablet, Take 1-2 tablets by mouth every 4 (four) hours as needed for severe pain., Disp: 30 tablet, Rfl: 0   oxyCODONE-acetaminophen (PERCOCET) 5-325 MG tablet, Take 1-2 tablets by mouth every 4 (four) hours as needed for severe pain., Disp: 30 tablet, Rfl: 0   oxyCODONE-acetaminophen (PERCOCET) 5-325 MG tablet, Take 1 tablet by mouth every 4 (four) hours as needed for severe pain., Disp: 30 tablet, Rfl: 0  Social History   Tobacco Use  Smoking Status Never  Smokeless Tobacco Never    No Known Allergies Objective:  There were no vitals filed for this visit. There is no height or weight on file to calculate BMI. Constitutional Well developed. Well nourished.  Vascular Foot warm and well perfused. Capillary refill normal to all digits.   Neurologic Normal speech. Oriented to person, place, and time. Epicritic sensation to light touch grossly present bilaterally.  Dermatologic Skin has completely epithelialized.  Good range of motion noted first metatarsophalangeal joint.  I encouraged her to continue increasing the range of motion  Orthopedic: Mild tenderness to palpation noted about the surgical site.   Radiographs: 3 views of skeletally mature adult left foot: Good correction alignment noted.  Hardware  is intact no signs of backing out or loosening noted Assessment:   1. Status post foot surgery   2. Pes planovalgus     Plan:  Patient was evaluated and treated and all questions answered.  S/p foot surgery left -Clinically resolved and improving at this time.  I encouraged her to continue doing range of motion exercises to improve range of motion.  I also discussed the importance of shoe gear modification with orthotics.  She states understanding.  Overall she is very happy with the surgery. -I will see her back again in 6 weeks for final follow-up and possible discharge.   Pes planovalgus -I explained to the patient the etiology of pes planovalgus and various treatment options were extensively discussed.  Now that the surgical reconstruction of both of the foot has been accomplished I believe patient is ready for orthotics to help  control the hindfoot motion support the arch of the foot.  Patient agrees with plan like to proceed with getting orthotics. -She was casted for orthotics today.  No follow-ups on file.

## 2021-09-02 ENCOUNTER — Other Ambulatory Visit: Payer: Self-pay

## 2021-09-02 ENCOUNTER — Ambulatory Visit (INDEPENDENT_AMBULATORY_CARE_PROVIDER_SITE_OTHER): Payer: No Typology Code available for payment source | Admitting: Podiatry

## 2021-09-02 ENCOUNTER — Ambulatory Visit (INDEPENDENT_AMBULATORY_CARE_PROVIDER_SITE_OTHER): Payer: No Typology Code available for payment source

## 2021-09-02 DIAGNOSIS — Z9889 Other specified postprocedural states: Secondary | ICD-10-CM

## 2021-09-02 DIAGNOSIS — M960 Pseudarthrosis after fusion or arthrodesis: Secondary | ICD-10-CM

## 2021-09-02 DIAGNOSIS — T8484XA Pain due to internal orthopedic prosthetic devices, implants and grafts, initial encounter: Secondary | ICD-10-CM

## 2021-09-02 DIAGNOSIS — Q666 Other congenital valgus deformities of feet: Secondary | ICD-10-CM

## 2021-09-08 NOTE — Progress Notes (Signed)
Subjective:  Patient ID: Joanna Jordan, female    DOB: [birth year],  MRN: [id number]  Chief Complaint  Patient presents with   Routine Post Op     POV #4 DOS 05/18/2021 LT REVISION TARSOMETATARSAL JOINT FUSION, REMOVAL OF HARDWARE W/PHALANGEAL OSTEOTOMY W/FIXATION    DOS: 05/18/2021 Procedure: Left revisional Lapidus procedure with Akin osteotomy  20 y.o. female returns for post-op check.  Patient states she is doing well.  Denies any acute complaints.  Mild pain.  Feels a lot better.  She can begin transitioning to regular shoes and increasing range of motion exercises.  Review of Systems: Negative except as noted in the HPI. Denies N/V/F/Ch.  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, Disp: 30 tablet, Rfl: 0   acetaminophen-codeine (TYLENOL #3) 300-30 MG tablet, Take 1-2 tablets by mouth every 4 (four) hours as needed for moderate pain., Disp: 30 tablet, Rfl: 0   bismuth subsalicylate (PEPTO BISMOL) 262 MG chewable tablet, Chew 524 mg by mouth daily as needed for indigestion or diarrhea or loose stools., Disp: , Rfl:    cetirizine (ZYRTEC) 10 MG tablet, Take 10 mg by mouth every evening. , Disp: , Rfl:    cholecalciferol (VITAMIN D3) 25 MCG (1000 UNIT) tablet, Take 1,000 Units  by mouth daily., Disp: , Rfl:    doxycycline (VIBRA-TABS) 100 MG tablet, Take 1 tablet (100 mg total) by mouth 2 (two) times daily., Disp: 20 tablet, Rfl: 0   fluticasone-salmeterol (ADVAIR HFA) 115-21 MCG/ACT inhaler, Inhale 2 puffs into the lungs 2 (two) times daily as needed (shortness of breath)., Disp: , Rfl:    HYDROcodone-acetaminophen (NORCO) 10-325 MG tablet, Take 1 tablet by mouth every 6 (six) hours as needed., Disp: 30 tablet, Rfl: 0   ibuprofen (ADVIL) 800 MG tablet, Take 1 tablet (800 mg total) by mouth every 6 (six) hours as needed., Disp: 60 tablet, Rfl: 1   ibuprofen (ADVIL) 800 MG tablet, Take 1 tablet (800 mg total) by mouth every 6 (six) hours as needed., Disp: 60 tablet, Rfl: 1   ibuprofen (ADVIL) 800 MG tablet, Take 1 tablet (800 mg total) by mouth every 6 (six) hours as needed., Disp: 60 tablet, Rfl: 1   ibuprofen (ADVIL) 800 MG tablet, Take 1 tablet (800 mg total) by mouth every 6 (six) hours as needed., Disp: 60 tablet, Rfl: 1   ketorolac (TORADOL) 10 MG tablet, Take 1 tablet (10 mg total) by mouth every 6 (six) hours as needed., Disp: 20 tablet, Rfl: 0   Multiple Vitamin (MULTIVITAMIN WITH MINERALS) TABS tablet, Take 1 tablet by mouth every evening., Disp: , Rfl:    norethindrone-ethinyl estradiol (LOESTRIN) 1-20 MG-MCG tablet, Take  1 tablet by mouth every evening. , Disp: , Rfl:    ondansetron (ZOFRAN) 4 MG tablet, Take 1 tablet (4 mg total) by mouth every 8 (eight) hours as needed for nausea or vomiting., Disp: 20 tablet, Rfl: 0   ondansetron (ZOFRAN) 4 MG tablet, Take 1 tablet (4 mg total) by mouth every 8 (eight) hours as needed for nausea or vomiting., Disp: 20 tablet, Rfl: 0   oxyCODONE-acetaminophen (PERCOCET) 10-325 MG tablet, Take 1 tablet by mouth every 4 (four) hours as needed for pain., Disp: 30 tablet, Rfl: 0   oxyCODONE-acetaminophen (PERCOCET) 10-325 MG tablet, Take 1 tablet by mouth every 4 (four) hours as needed for pain., Disp: 30 tablet, Rfl: 0    oxyCODONE-acetaminophen (PERCOCET) 5-325 MG tablet, Take 1-2 tablets by mouth every 4 (four) hours as needed for severe pain., Disp: 30 tablet, Rfl: 0   oxyCODONE-acetaminophen (PERCOCET) 5-325 MG tablet, Take 1-2 tablets by mouth every 4 (four) hours as needed for severe pain., Disp: 30 tablet, Rfl: 0   oxyCODONE-acetaminophen (PERCOCET) 5-325 MG tablet, Take 1-2 tablets by mouth every 4 (four) hours as needed for severe pain., Disp: 30 tablet, Rfl: 0   oxyCODONE-acetaminophen (PERCOCET) 5-325 MG tablet, Take 1-2 tablets by mouth every 4 (four) hours as needed for severe pain., Disp: 30 tablet, Rfl: 0   oxyCODONE-acetaminophen (PERCOCET) 5-325 MG tablet, Take 1-2 tablets by mouth every 4 (four) hours as needed for severe pain., Disp: 30 tablet, Rfl: 0   oxyCODONE-acetaminophen (PERCOCET) 5-325 MG tablet, Take 1-2 tablets by mouth every 4 (four) hours as needed for severe pain., Disp: 30 tablet, Rfl: 0   oxyCODONE-acetaminophen (PERCOCET) 5-325 MG tablet, Take 1-2 tablets by mouth every 4 (four) hours as needed for severe pain., Disp: 30 tablet, Rfl: 0   oxyCODONE-acetaminophen (PERCOCET) 5-325 MG tablet, Take 1 tablet by mouth every 4 (four) hours as needed for severe pain., Disp: 30 tablet, Rfl: 0  Social History   Tobacco Use  Smoking Status Never  Smokeless Tobacco Never    No Known Allergies Objective:  There were no vitals filed for this visit. There is no height or weight on file to calculate BMI. Constitutional Well developed. Well nourished.  Vascular Foot warm and well perfused. Capillary refill normal to all digits.   Neurologic Normal speech. Oriented to person, place, and time. Epicritic sensation to light touch grossly present bilaterally.  Dermatologic Skin has completely epithelialized.  Good range of motion noted first metatarsophalangeal joint.  I encouraged her to continue increasing the range of motion  Orthopedic: Mild tenderness to palpation noted about the surgical  site.   Radiographs: 3 views of skeletally mature adult left foot: Good correction alignment noted.  Hardware is intact no signs of backing out or loosening noted consolidation noted across the fusion site Assessment:   1. Status post foot surgery   2. Pes planovalgus   3. 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  S/p foot surgery left -Clinically resolved and improving at this time.  I encouraged her to continue doing range of motion exercises to improve range of motion.  I also discussed the importance of shoe gear modification with orthotics.  She states understanding.  Overall she is very happy with the surgery. -Orthotics are functioning well.  At this time patient is officially discharged from my care.  If any foot and ankle issues arise future come back  and see me.  She states understanding   Pes planovalgus -I explained to the patient the etiology of pes planovalgus and various treatment options were extensively discussed.  Now that the surgical reconstruction of both of the foot has been accomplished I believe patient is ready for orthotics to help control the hindfoot motion support the arch of the foot.  Patient agrees with plan like to proceed with getting orthotics. -Orthotics were dispensed and functioning well.  I discussed break-in period with her in extensive detail  No follow-ups on file.

## 2021-12-01 ENCOUNTER — Encounter: Payer: Self-pay | Admitting: Podiatry

## 2021-12-11 ENCOUNTER — Other Ambulatory Visit: Payer: Self-pay

## 2021-12-11 ENCOUNTER — Ambulatory Visit (INDEPENDENT_AMBULATORY_CARE_PROVIDER_SITE_OTHER): Payer: No Typology Code available for payment source | Admitting: Podiatry

## 2021-12-11 DIAGNOSIS — M7752 Other enthesopathy of left foot: Secondary | ICD-10-CM | POA: Diagnosis not present

## 2021-12-16 ENCOUNTER — Encounter: Payer: Self-pay | Admitting: Podiatry

## 2021-12-16 NOTE — Progress Notes (Signed)
?Subjective:  ?Patient ID: Joanna Jordan, female    DOB: [birth year],  MRN: [id number] ? ?Chief Complaint  ?Patient presents with  ? Foot Pain  ?    injection in L foot   ? ? ?21 y.o. female presents with the above complaint.  Patient presents with left first MPJ capsulitis/stiffness.  Patient states that sometimes it bothers her to walk on it.  Her pain has gotten a little bit to mild to moderate in nature.  She states the incision as well as proximal nail is doing fine as well as the incision part in the midfoot.  She would like to discuss options for the capsulitis she has not seen anyone else prior to seeing me. ? ? ?Review of Systems: Negative except as noted in the HPI. Denies N/V/F/Ch. ? ?Past Medical History:  ?Diagnosis Date  ? Allergy   ? " seasonal "  ? Asthma   ? Bunion   ?  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 Pneumonia   ? PMH as a child  ? Shingles rash   ? at age 44 after varicella vaccine  ? Wears glasses   ? and contact lenses  ? ? ?Current Outpatient Medications:  ?  acetaminophen-codeine (TYLENOL #3) 300-30 MG tablet, Take 1-2 tablets by mouth every 4 (four) hours as needed for moderate pain., Disp: 30 tablet, Rfl: 0 ?  acetaminophen-codeine (TYLENOL #3) 300-30 MG tablet, Take 1-2 tablets by mouth every 4 (four) hours as needed for moderate pain., Disp: 30 tablet, Rfl: 0 ?  acetaminophen-codeine (TYLENOL #3) 300-30 MG tablet, Take 1-2 tablets by mouth every 4 (four) hours as needed for moderate pain., Disp: 30 tablet, Rfl: 0 ?  acetaminophen-codeine (TYLENOL #3) 300-30 MG tablet, Take 1-2 tablets by mouth every 4 (four) hours as needed for moderate pain., Disp: 30 tablet, Rfl: 0 ?  bismuth subsalicylate (PEPTO BISMOL) 262 MG chewable tablet, Chew 524 mg by mouth daily as needed for indigestion or diarrhea or loose stools., Disp: , Rfl:  ?  cetirizine (ZYRTEC) 10 MG tablet, Take 10 mg by mouth every evening. , Disp: , Rfl:  ?  cholecalciferol (VITAMIN D3) 25 MCG  (1000 UNIT) tablet, Take 1,000 Units by mouth daily., Disp: , Rfl:  ?  doxycycline (VIBRA-TABS) 100 MG tablet, Take 1 tablet (100 mg total) by mouth 2 (two) times daily., Disp: 20 tablet, Rfl: 0 ?  fluticasone-salmeterol (ADVAIR HFA) 115-21 MCG/ACT inhaler, Inhale 2 puffs into the lungs 2 (two) times daily as needed (shortness of breath)., Disp: , Rfl:  ?  HYDROcodone-acetaminophen (NORCO) 10-325 MG tablet, Take 1 tablet by mouth every 6 (six) hours as needed., Disp: 30 tablet, Rfl: 0 ?  ibuprofen (ADVIL) 800 MG tablet, Take 1 tablet (800 mg total) by mouth every 6 (six) hours as needed., Disp: 60 tablet, Rfl: 1 ?  ibuprofen (ADVIL) 800 MG tablet, Take 1 tablet (800 mg total) by mouth every 6 (six) hours as needed., Disp: 60 tablet, Rfl: 1 ?  ibuprofen (ADVIL) 800 MG tablet, Take 1 tablet (800 mg total) by mouth every 6 (six) hours as needed., Disp: 60 tablet, Rfl: 1 ?  ibuprofen (ADVIL) 800 MG tablet, Take 1 tablet (800 mg total) by mouth every 6 (six) hours as needed., Disp: 60 tablet, Rfl: 1 ?  ketorolac (TORADOL) 10 MG tablet, Take 1 tablet (10 mg total) by mouth every 6 (six) hours as needed., Disp: 20 tablet, Rfl: 0 ?  Multiple Vitamin (MULTIVITAMIN WITH  MINERALS) TABS tablet, Take 1 tablet by mouth every evening., Disp: , Rfl:  ?  norethindrone-ethinyl estradiol (LOESTRIN) 1-20 MG-MCG tablet, Take 1 tablet by mouth every evening. , Disp: , Rfl:  ?  ondansetron (ZOFRAN) 4 MG tablet, Take 1 tablet (4 mg total) by mouth every 8 (eight) hours as needed for nausea or vomiting., Disp: 20 tablet, Rfl: 0 ?  ondansetron (ZOFRAN) 4 MG tablet, Take 1 tablet (4 mg total) by mouth every 8 (eight) hours as needed for nausea or vomiting., Disp: 20 tablet, Rfl: 0 ?  oxyCODONE-acetaminophen (PERCOCET) 10-325 MG tablet, Take 1 tablet by mouth every 4 (four) hours as needed for pain., Disp: 30 tablet, Rfl: 0 ?  oxyCODONE-acetaminophen (PERCOCET) 10-325 MG tablet, Take 1 tablet by mouth every 4 (four) hours as needed for pain.,  Disp: 30 tablet, Rfl: 0 ?  oxyCODONE-acetaminophen (PERCOCET) 5-325 MG tablet, Take 1-2 tablets by mouth every 4 (four) hours as needed for severe pain., Disp: 30 tablet, Rfl: 0 ?  oxyCODONE-acetaminophen (PERCOCET) 5-325 MG tablet, Take 1-2 tablets by mouth every 4 (four) hours as needed for severe pain., Disp: 30 tablet, Rfl: 0 ?  oxyCODONE-acetaminophen (PERCOCET) 5-325 MG tablet, Take 1-2 tablets by mouth every 4 (four) hours as needed for severe pain., Disp: 30 tablet, Rfl: 0 ?  oxyCODONE-acetaminophen (PERCOCET) 5-325 MG tablet, Take 1-2 tablets by mouth every 4 (four) hours as needed for severe pain., Disp: 30 tablet, Rfl: 0 ?  oxyCODONE-acetaminophen (PERCOCET) 5-325 MG tablet, Take 1-2 tablets by mouth every 4 (four) hours as needed for severe pain., Disp: 30 tablet, Rfl: 0 ?  oxyCODONE-acetaminophen (PERCOCET) 5-325 MG tablet, Take 1-2 tablets by mouth every 4 (four) hours as needed for severe pain., Disp: 30 tablet, Rfl: 0 ?  oxyCODONE-acetaminophen (PERCOCET) 5-325 MG tablet, Take 1-2 tablets by mouth every 4 (four) hours as needed for severe pain., Disp: 30 tablet, Rfl: 0 ?  oxyCODONE-acetaminophen (PERCOCET) 5-325 MG tablet, Take 1 tablet by mouth every 4 (four) hours as needed for severe pain., Disp: 30 tablet, Rfl: 0 ? ?Social History  ? ?Tobacco Use  ?Smoking Status Never  ?Smokeless Tobacco Never  ? ? ?No Known Allergies ?Objective:  ?There were no vitals filed for this visit. ?There is no height or weight on file to calculate BMI. ?Constitutional Well developed. ?Well nourished.  ?Vascular Dorsalis pedis pulses palpable bilaterally. ?Posterior tibial pulses palpable bilaterally. ?Capillary refill normal to all digits.  ?No cyanosis or clubbing noted. ?Pedal hair growth normal.  ?Neurologic Normal speech. ?Oriented to person, place, and time. ?Epicritic sensation to light touch grossly present bilaterally.  ?Dermatologic Nails well groomed and normal in appearance. ?No open wounds. ?No skin  lesions.  ?Orthopedic: Pain on palpation left first MPJ.  Pain with range of motion of the joint.  Limited motion noted at the first metatarsophalangeal joint.  Likely due to scarring of the tissue  ? ?Radiographs: None ?Assessment:  ? ?1. Capsulitis of metatarsophalangeal (MTP) joint of left foot   ? ?Plan:  ?Patient was evaluated and treated and all questions answered. ? ?Left first MPJ capsulitis ?-All questions and concerns were discussed with the patient in extensive detail given the amount of pain that she is having I believe she will benefit from steroid injection help decrease acute inflammatory component associate with pain.  Patient agrees with plan like to proceed with steroid injection. ?-A steroid injection was performed at left first MPJ using 1% plain Lidocaine and 10 mg of Kenalog. This was well  tolerated. ? ? ?No follow-ups on file.  ?

## 2022-03-03 ENCOUNTER — Ambulatory Visit (INDEPENDENT_AMBULATORY_CARE_PROVIDER_SITE_OTHER): Payer: No Typology Code available for payment source | Admitting: Podiatry

## 2022-03-03 ENCOUNTER — Ambulatory Visit (INDEPENDENT_AMBULATORY_CARE_PROVIDER_SITE_OTHER): Payer: No Typology Code available for payment source

## 2022-03-03 ENCOUNTER — Encounter: Payer: Self-pay | Admitting: Podiatry

## 2022-03-03 DIAGNOSIS — M7752 Other enthesopathy of left foot: Secondary | ICD-10-CM

## 2022-03-09 ENCOUNTER — Encounter: Payer: Self-pay | Admitting: Podiatry

## 2022-03-09 NOTE — Progress Notes (Signed)
Subjective:  Patient ID: Joanna Jordan, female    DOB: August 11, 2001,  MRN: [id number]  Chief Complaint  Patient presents with   Routine Post Op    21 y.o. female presents with the above complaint.  Patient presents with left first MPJ capsulitis/stiffness.  She states the injection does help.  She states she is doing a lot better.  She denies any other acute complaints.   Review of Systems: Negative except as noted in the HPI. Denies N/V/F/Ch.  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, Disp: 30 tablet, Rfl: 0   acetaminophen-codeine (TYLENOL #3) 300-30 MG tablet, Take 1-2 tablets by mouth every 4 (four) hours as needed for moderate pain., Disp: 30 tablet, Rfl: 0   bismuth subsalicylate (PEPTO BISMOL) 262 MG chewable tablet, Chew 524 mg by mouth daily as needed for indigestion or diarrhea or loose stools., Disp: , Rfl:    cetirizine (ZYRTEC) 10 MG tablet, Take 10 mg by mouth every evening. , Disp: , Rfl:    cholecalciferol (VITAMIN D3) 25 MCG (1000 UNIT) tablet, Take 1,000 Units by mouth daily., Disp: , Rfl:    doxycycline (VIBRA-TABS) 100 MG tablet, Take 1 tablet (100 mg total) by mouth 2 (two) times daily., Disp: 20 tablet, Rfl: 0   fluticasone-salmeterol (ADVAIR HFA) 115-21  MCG/ACT inhaler, Inhale 2 puffs into the lungs 2 (two) times daily as needed (shortness of breath)., Disp: , Rfl:    HYDROcodone-acetaminophen (NORCO) 10-325 MG tablet, Take 1 tablet by mouth every 6 (six) hours as needed., Disp: 30 tablet, Rfl: 0   ibuprofen (ADVIL) 800 MG tablet, Take 1 tablet (800 mg total) by mouth every 6 (six) hours as needed., Disp: 60 tablet, Rfl: 1   ibuprofen (ADVIL) 800 MG tablet, Take 1 tablet (800 mg total) by mouth every 6 (six) hours as needed., Disp: 60 tablet, Rfl: 1   ibuprofen (ADVIL) 800 MG tablet, Take 1 tablet (800 mg total) by mouth every 6 (six) hours as needed., Disp: 60 tablet, Rfl: 1   ibuprofen (ADVIL) 800 MG tablet, Take 1 tablet (800 mg total) by mouth every 6 (six) hours as needed., Disp: 60 tablet, Rfl: 1   ketorolac (TORADOL) 10 MG tablet, Take 1 tablet (10 mg total) by mouth every 6 (six) hours as needed., Disp: 20 tablet, Rfl: 0   Multiple Vitamin (MULTIVITAMIN WITH MINERALS) TABS tablet, Take 1 tablet by mouth every evening., Disp: , Rfl:    norethindrone-ethinyl estradiol (LOESTRIN) 1-20 MG-MCG tablet, Take 1 tablet by mouth every evening. , Disp: , Rfl:    ondansetron (ZOFRAN) 4 MG tablet, Take 1 tablet (4 mg total) by mouth every 8 (eight) hours as  needed for nausea or vomiting., Disp: 20 tablet, Rfl: 0   ondansetron (ZOFRAN) 4 MG tablet, Take 1 tablet (4 mg total) by mouth every 8 (eight) hours as needed for nausea or vomiting., Disp: 20 tablet, Rfl: 0   oxyCODONE-acetaminophen (PERCOCET) 10-325 MG tablet, Take 1 tablet by mouth every 4 (four) hours as needed for pain., Disp: 30 tablet, Rfl: 0   oxyCODONE-acetaminophen (PERCOCET) 10-325 MG tablet, Take 1 tablet by mouth every 4 (four) hours as needed for pain., Disp: 30 tablet, Rfl: 0   oxyCODONE-acetaminophen (PERCOCET) 5-325 MG tablet, Take 1-2 tablets by mouth every 4 (four) hours as needed for severe pain., Disp: 30 tablet, Rfl: 0   oxyCODONE-acetaminophen (PERCOCET) 5-325 MG tablet, Take 1-2  tablets by mouth every 4 (four) hours as needed for severe pain., Disp: 30 tablet, Rfl: 0   oxyCODONE-acetaminophen (PERCOCET) 5-325 MG tablet, Take 1-2 tablets by mouth every 4 (four) hours as needed for severe pain., Disp: 30 tablet, Rfl: 0   oxyCODONE-acetaminophen (PERCOCET) 5-325 MG tablet, Take 1-2 tablets by mouth every 4 (four) hours as needed for severe pain., Disp: 30 tablet, Rfl: 0   oxyCODONE-acetaminophen (PERCOCET) 5-325 MG tablet, Take 1-2 tablets by mouth every 4 (four) hours as needed for severe pain., Disp: 30 tablet, Rfl: 0   oxyCODONE-acetaminophen (PERCOCET) 5-325 MG tablet, Take 1-2 tablets by mouth every 4 (four) hours as needed for severe pain., Disp: 30 tablet, Rfl: 0   oxyCODONE-acetaminophen (PERCOCET) 5-325 MG tablet, Take 1-2 tablets by mouth every 4 (four) hours as needed for severe pain., Disp: 30 tablet, Rfl: 0   oxyCODONE-acetaminophen (PERCOCET) 5-325 MG tablet, Take 1 tablet by mouth every 4 (four) hours as needed for severe pain., Disp: 30 tablet, Rfl: 0  Social History   Tobacco Use  Smoking Status Never  Smokeless Tobacco Never    No Known Allergies Objective:  There were no vitals filed for this visit. There is no height or weight on file to calculate BMI. Constitutional Well developed. Well nourished.  Vascular Dorsalis pedis pulses palpable bilaterally. Posterior tibial pulses palpable bilaterally. Capillary refill normal to all digits.  No cyanosis or clubbing noted. Pedal hair growth normal.  Neurologic Normal speech. Oriented to person, place, and time. Epicritic sensation to light touch grossly present bilaterally.  Dermatologic Nails well groomed and normal in appearance. No open wounds. No skin lesions.  Orthopedic: Pain on palpation left first MPJ.  Pain with range of motion of the joint.  Limited motion noted at the first metatarsophalangeal joint.  Likely due to scarring of the tissue   Radiographs: None Assessment:   1.  Capsulitis of metatarsophalangeal (MTP) joint of left foot    Plan:  Patient was evaluated and treated and all questions answered.  Left first MPJ capsulitis -All questions and concerns were discussed with the patient in extensive detail given the amount of pain that she is having I believe she will benefit from steroid injection help decrease acute inflammatory component associate with pain.  Patient agrees with plan like to proceed with steroid injection. -A second steroid injection was performed at left first MPJ using 1% plain Lidocaine and 10 mg of Kenalog. This was well tolerated. -X-ray was reviewed.  Slightly dorsiflexed metatarsal that could likely be leading to capsulitis and hallux limitus. -I discussed with the patient and her parent today that we could do another surgery to plantarflex the metatarsal head.  I discussed this with the patient they would like to hold off of surgery  for now.  No follow-ups on file.

## 2022-04-04 IMAGING — RF DG FOOT COMPLETE 3+V*R*
1 series · 3 of 3 positions shown · non-contrast
Comparison: March 13, 2020.

CLINICAL DATA: Elective surgery of right foot.

EXAM:
RIGHT FOOT COMPLETE - 3+ VIEW; DG C-ARM 1-60 MIN
Radiation exposure index: 0.6265 mGy.

[Series 1: unknown protocol · 0.14mm/px · 3 of 3 slices shown]
[im 1/3]
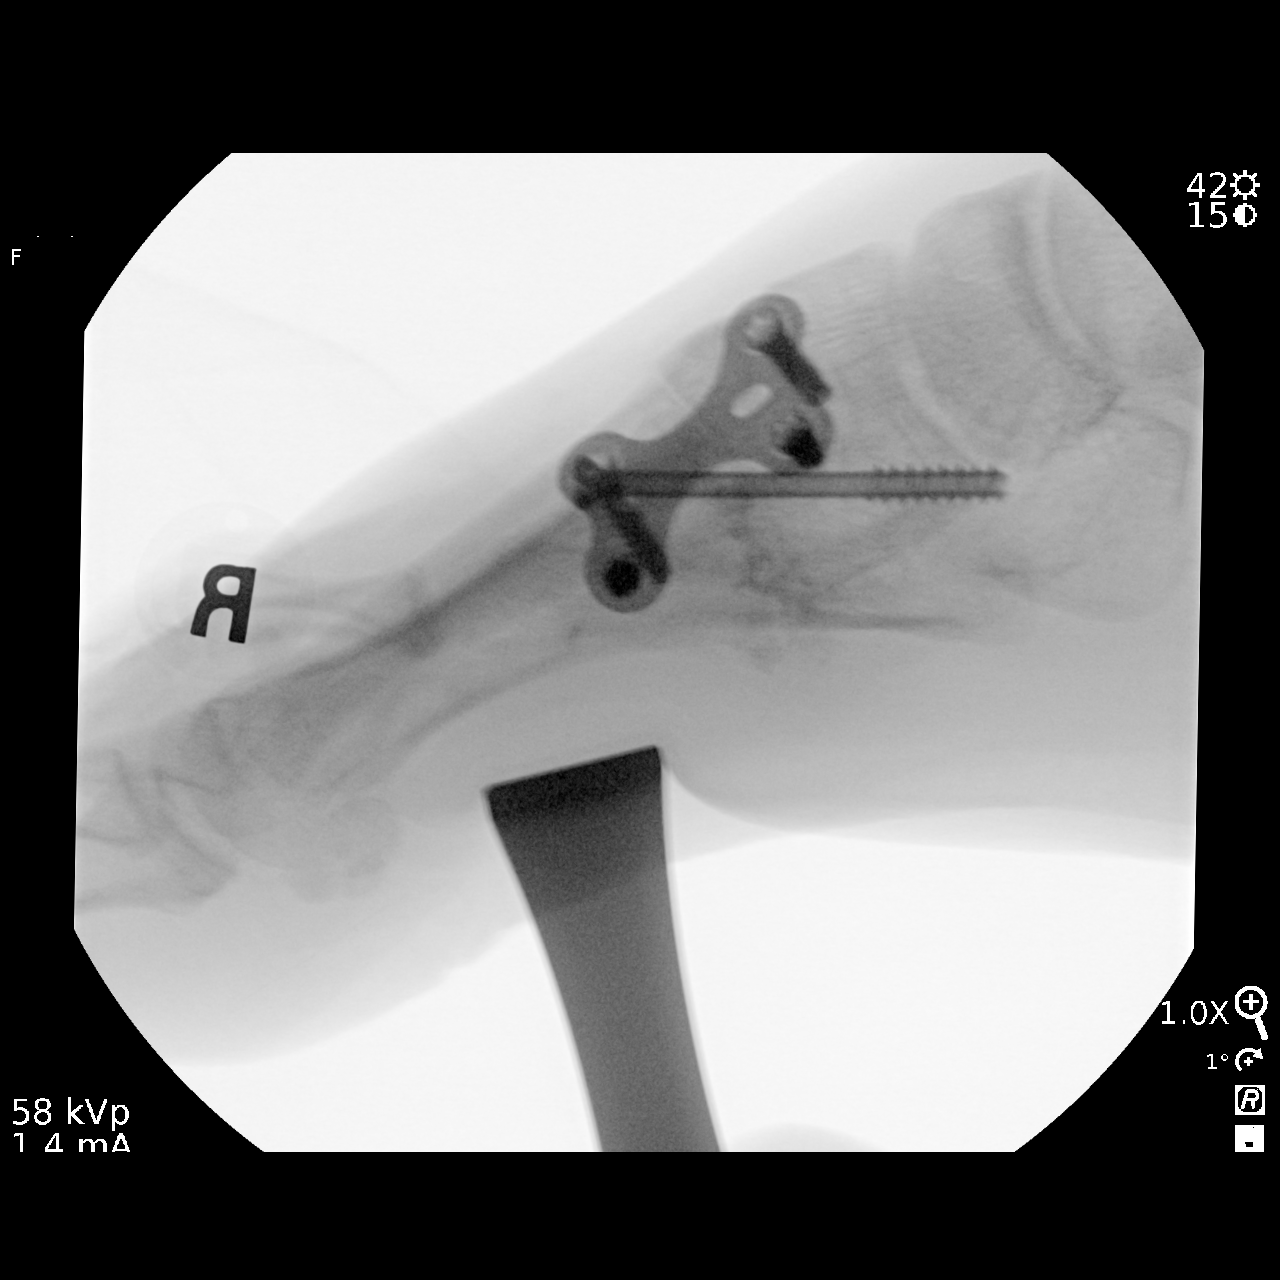
[im 2/3]
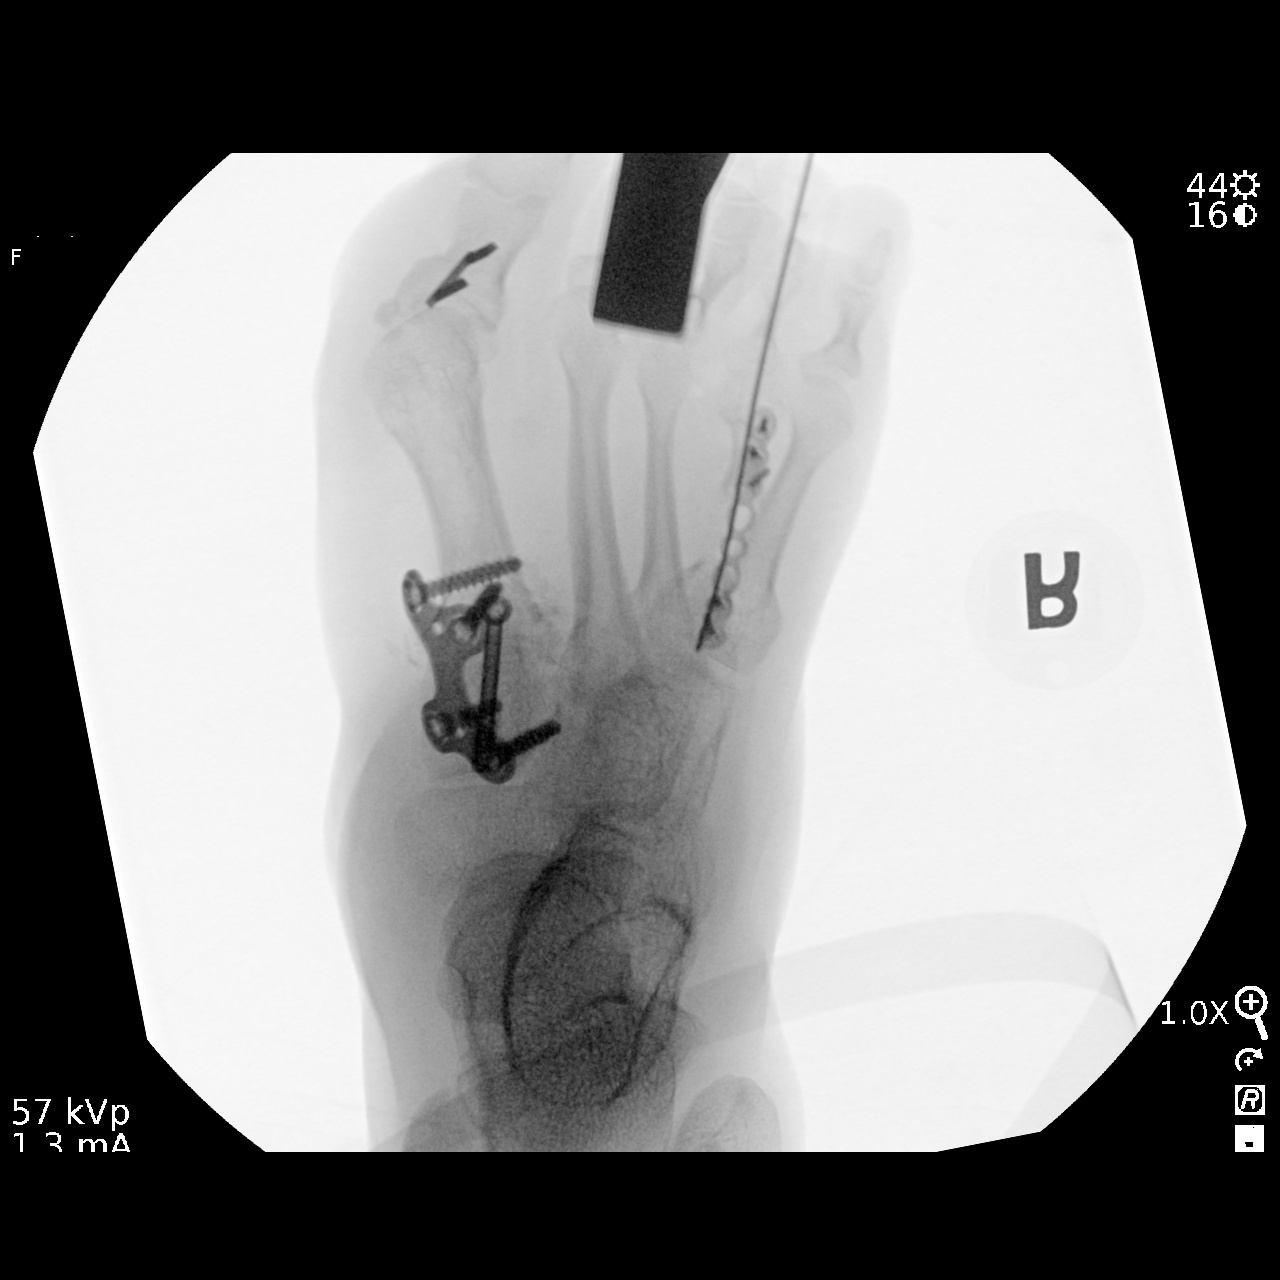
[im 3/3]
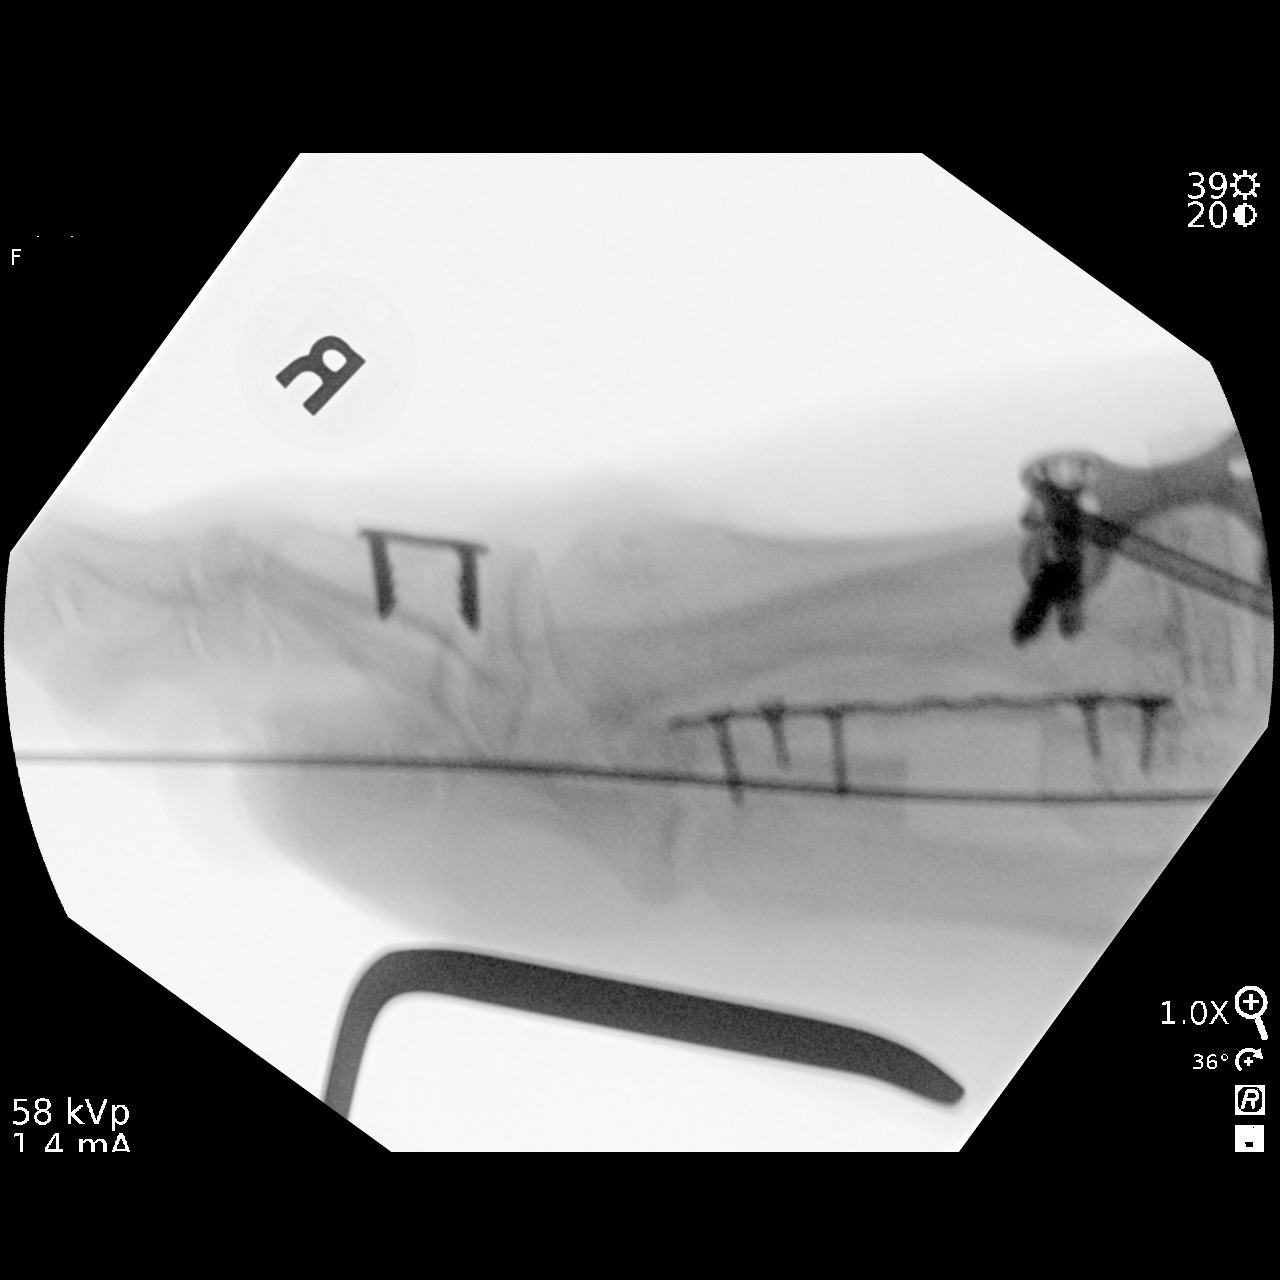

[3 of 3 positions shown; findings below may reference images not displayed]

FINDINGS: Three intraoperative fluoroscopic images were obtained of the right
foot. These images demonstrate surgical fusion of the first
tarsometatarsal joint. Osteotomy and internal fixation of fourth
metatarsal is noted. Surgical staples seen in the first proximal
phalanx.
IMPRESSION: Fluoroscopic guidance provided during right foot surgery.

## 2023-08-17 ENCOUNTER — Ambulatory Visit (INDEPENDENT_AMBULATORY_CARE_PROVIDER_SITE_OTHER): Payer: Self-pay

## 2023-08-17 ENCOUNTER — Ambulatory Visit (INDEPENDENT_AMBULATORY_CARE_PROVIDER_SITE_OTHER): Payer: No Typology Code available for payment source | Admitting: Podiatry

## 2023-08-17 DIAGNOSIS — M778 Other enthesopathies, not elsewhere classified: Secondary | ICD-10-CM

## 2023-08-17 DIAGNOSIS — M21272 Flexion deformity, left ankle and toes: Secondary | ICD-10-CM

## 2023-08-17 NOTE — Progress Notes (Signed)
Subjective:  Patient ID: Joanna Jordan, female    DOB: 2000-10-12,  MRN: [id number]  Chief Complaint  Patient presents with   Post-op Problem    RM#11 Left foot bunion surgery now having pain needs medical opinion.    22 y.o. female presents with the above complaint.  Patient presents with left first MPJ capsulitis/stiffness.  She states the injection does help.  She states that does help but it started to come back again.  Eventually may need to discuss surgery  Review of Systems: Negative except as noted in the HPI. Denies N/V/F/Ch.  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   Pneumonia    PMH as a child   Shingles rash    at age 9 after varicella vaccine   Wears glasses    and contact lenses    Current Outpatient Medications:    acetaminophen-codeine (TYLENOL #3) 300-30 MG tablet, Take 1-2 tablets by mouth every 4 (four) hours as needed for moderate pain., Disp: 30 tablet, Rfl: 0   acetaminophen-codeine (TYLENOL #3) 300-30 MG tablet, Take 1-2 tablets by mouth every 4 (four) hours as needed for moderate pain., Disp: 30 tablet, Rfl: 0   acetaminophen-codeine (TYLENOL #3) 300-30 MG tablet, Take 1-2 tablets by mouth every 4 (four) hours as needed for moderate pain., Disp: 30 tablet, Rfl: 0   acetaminophen-codeine (TYLENOL #3) 300-30 MG tablet, Take 1-2 tablets by mouth every 4 (four) hours as needed for moderate pain., Disp: 30 tablet, Rfl: 0   bismuth subsalicylate (PEPTO BISMOL) 262 MG chewable tablet, Chew 524 mg by mouth daily as needed for indigestion or diarrhea or loose stools., Disp: , Rfl:    cetirizine (ZYRTEC) 10 MG tablet, Take 10 mg by mouth every evening., Disp: , Rfl:    cholecalciferol (VITAMIN D3) 25 MCG (1000 UNIT) tablet, Take 1,000 Units by mouth daily., Disp: , Rfl:    doxycycline (VIBRA-TABS) 100 MG tablet, Take 1 tablet (100 mg total) by mouth  2 (two) times daily., Disp: 20 tablet, Rfl: 0   fluticasone-salmeterol (ADVAIR HFA) 115-21 MCG/ACT inhaler, Inhale 2 puffs into the lungs 2 (two) times daily as needed (shortness of breath)., Disp: , Rfl:    HYDROcodone-acetaminophen (NORCO) 10-325 MG tablet, Take 1 tablet by mouth every 6 (six) hours as needed., Disp: 30 tablet, Rfl: 0   ibuprofen (ADVIL) 800 MG tablet, Take 1 tablet (800 mg total) by mouth every 6 (six) hours as needed., Disp: 60 tablet, Rfl: 1   ibuprofen (ADVIL) 800 MG tablet, Take 1 tablet (800 mg total) by mouth every 6 (six) hours as needed., Disp: 60 tablet, Rfl: 1   ibuprofen (ADVIL) 800 MG tablet, Take 1 tablet (800 mg total) by mouth every 6 (six) hours as needed., Disp: 60 tablet, Rfl: 1   ibuprofen (ADVIL) 800 MG tablet, Take 1 tablet (800 mg total) by mouth every 6 (six) hours as needed., Disp: 60 tablet, Rfl: 1   ketorolac (TORADOL) 10 MG tablet, Take 1 tablet (10 mg total) by mouth every 6 (six) hours as needed., Disp: 20 tablet, Rfl: 0   Multiple Vitamin (MULTIVITAMIN WITH MINERALS) TABS tablet, Take 1 tablet by mouth every evening., Disp: , Rfl:    norethindrone-ethinyl estradiol (LOESTRIN) 1-20 MG-MCG tablet, Take 1 tablet by mouth every evening. , Disp: , Rfl:    ondansetron (ZOFRAN) 4  MG tablet, Take 1 tablet (4 mg total) by mouth every 8 (eight) hours as needed for nausea or vomiting., Disp: 20 tablet, Rfl: 0   ondansetron (ZOFRAN) 4 MG tablet, Take 1 tablet (4 mg total) by mouth every 8 (eight) hours as needed for nausea or vomiting., Disp: 20 tablet, Rfl: 0   oxyCODONE-acetaminophen (PERCOCET) 10-325 MG tablet, Take 1 tablet by mouth every 4 (four) hours as needed for pain. (Patient not taking: Reported on 08/17/2023), Disp: 30 tablet, Rfl: 0   oxyCODONE-acetaminophen (PERCOCET) 10-325 MG tablet, Take 1 tablet by mouth every 4 (four) hours as needed for pain. (Patient not taking: Reported on 08/17/2023), Disp: 30 tablet, Rfl: 0   oxyCODONE-acetaminophen  (PERCOCET) 5-325 MG tablet, Take 1-2 tablets by mouth every 4 (four) hours as needed for severe pain. (Patient not taking: Reported on 08/17/2023), Disp: 30 tablet, Rfl: 0   oxyCODONE-acetaminophen (PERCOCET) 5-325 MG tablet, Take 1-2 tablets by mouth every 4 (four) hours as needed for severe pain. (Patient not taking: Reported on 08/17/2023), Disp: 30 tablet, Rfl: 0   oxyCODONE-acetaminophen (PERCOCET) 5-325 MG tablet, Take 1-2 tablets by mouth every 4 (four) hours as needed for severe pain. (Patient not taking: Reported on 08/17/2023), Disp: 30 tablet, Rfl: 0   oxyCODONE-acetaminophen (PERCOCET) 5-325 MG tablet, Take 1-2 tablets by mouth every 4 (four) hours as needed for severe pain. (Patient not taking: Reported on 08/17/2023), Disp: 30 tablet, Rfl: 0   oxyCODONE-acetaminophen (PERCOCET) 5-325 MG tablet, Take 1-2 tablets by mouth every 4 (four) hours as needed for severe pain. (Patient not taking: Reported on 08/17/2023), Disp: 30 tablet, Rfl: 0   oxyCODONE-acetaminophen (PERCOCET) 5-325 MG tablet, Take 1-2 tablets by mouth every 4 (four) hours as needed for severe pain. (Patient not taking: Reported on 08/17/2023), Disp: 30 tablet, Rfl: 0   oxyCODONE-acetaminophen (PERCOCET) 5-325 MG tablet, Take 1-2 tablets by mouth every 4 (four) hours as needed for severe pain. (Patient not taking: Reported on 08/17/2023), Disp: 30 tablet, Rfl: 0   oxyCODONE-acetaminophen (PERCOCET) 5-325 MG tablet, Take 1 tablet by mouth every 4 (four) hours as needed for severe pain. (Patient not taking: Reported on 08/17/2023), Disp: 30 tablet, Rfl: 0  Social History   Tobacco Use  Smoking Status Never  Smokeless Tobacco Never    No Known Allergies Objective:  There were no vitals filed for this visit. There is no height or weight on file to calculate BMI. Constitutional Well developed. Well nourished.  Vascular Dorsalis pedis pulses palpable bilaterally. Posterior tibial pulses palpable bilaterally. Capillary  refill normal to all digits.  No cyanosis or clubbing noted. Pedal hair growth normal.  Neurologic Normal speech. Oriented to person, place, and time. Epicritic sensation to light touch grossly present bilaterally.  Dermatologic Nails well groomed and normal in appearance. No open wounds. No skin lesions.  Orthopedic: Pain on palpation left first MPJ.  Pain with range of motion of the joint.  Limited motion noted at the first metatarsophalangeal joint.  Likely due to scarring of the tissue   Radiographs: None Assessment:   1. Capsulitis of foot   2. Metatarsus primus elevatus, left    Plan:  Patient was evaluated and treated and all questions answered.  Left first MPJ capsulitis -All questions and concerns were discussed with the patient in extensive detail given the amount of pain that she is having I believe she will benefit from steroid injection help decrease acute inflammatory component associate with pain.  Patient agrees with plan like to proceed with  steroid injection. -Another steroid injection was performed at left first MPJ using 1% plain Lidocaine and 10 mg of Kenalog. This was well tolerated. -X-ray was reviewed.  Slightly dorsiflexed metatarsal that could likely be leading to capsulitis and hallux limitus. -I discussed with the patient and her parent today that we could do another surgery to plantarflex the metatarsal head.  I discussed this with the patient they would like to hold off of surgery for now.  No follow-ups on file.
